# Patient Record
Sex: Female | Born: 1935 | Race: White | Hispanic: No | Smoking: Former smoker
Health system: Southern US, Community
[De-identification: ages and names within clinical notes are randomized; demographics above are authoritative.]

## PROBLEM LIST (undated history)

## (undated) DIAGNOSIS — J300 Vasomotor rhinitis: Secondary | ICD-10-CM

## (undated) DIAGNOSIS — H8110 Benign paroxysmal vertigo, unspecified ear: Secondary | ICD-10-CM

## (undated) DIAGNOSIS — N3090 Cystitis, unspecified without hematuria: Secondary | ICD-10-CM

## (undated) DIAGNOSIS — K219 Gastro-esophageal reflux disease without esophagitis: Secondary | ICD-10-CM

## (undated) DIAGNOSIS — G909 Disorder of the autonomic nervous system, unspecified: Secondary | ICD-10-CM

## (undated) DIAGNOSIS — F418 Other specified anxiety disorders: Secondary | ICD-10-CM

## (undated) DIAGNOSIS — J309 Allergic rhinitis, unspecified: Secondary | ICD-10-CM

## (undated) DIAGNOSIS — R42 Dizziness and giddiness: Secondary | ICD-10-CM

## (undated) DIAGNOSIS — E039 Hypothyroidism, unspecified: Secondary | ICD-10-CM

## (undated) DIAGNOSIS — L219 Seborrheic dermatitis, unspecified: Secondary | ICD-10-CM

## (undated) DIAGNOSIS — F32A Depression, unspecified: Secondary | ICD-10-CM

## (undated) DIAGNOSIS — I4891 Unspecified atrial fibrillation: Secondary | ICD-10-CM

## (undated) DIAGNOSIS — G459 Transient cerebral ischemic attack, unspecified: Secondary | ICD-10-CM

## (undated) DIAGNOSIS — W19XXXA Unspecified fall, initial encounter: Secondary | ICD-10-CM

## (undated) DIAGNOSIS — I10 Essential (primary) hypertension: Secondary | ICD-10-CM

## (undated) DIAGNOSIS — R21 Rash and other nonspecific skin eruption: Secondary | ICD-10-CM

## (undated) DIAGNOSIS — I951 Orthostatic hypotension: Secondary | ICD-10-CM

## (undated) DIAGNOSIS — R296 Repeated falls: Secondary | ICD-10-CM

## (undated) DIAGNOSIS — F329 Major depressive disorder, single episode, unspecified: Secondary | ICD-10-CM

## (undated) DIAGNOSIS — D649 Anemia, unspecified: Secondary | ICD-10-CM

## (undated) DIAGNOSIS — E871 Hypo-osmolality and hyponatremia: Secondary | ICD-10-CM

## (undated) DIAGNOSIS — R531 Weakness: Secondary | ICD-10-CM

## (undated) DIAGNOSIS — G255 Other chorea: Secondary | ICD-10-CM

## (undated) DIAGNOSIS — I69323 Fluency disorder following cerebral infarction: Secondary | ICD-10-CM

## (undated) DIAGNOSIS — M545 Low back pain, unspecified: Secondary | ICD-10-CM

## (undated) DIAGNOSIS — F331 Major depressive disorder, recurrent, moderate: Secondary | ICD-10-CM

## (undated) DIAGNOSIS — Z7901 Long term (current) use of anticoagulants: Secondary | ICD-10-CM

## (undated) DIAGNOSIS — I4892 Unspecified atrial flutter: Secondary | ICD-10-CM

## (undated) HISTORY — DX: Orthostatic hypotension: I95.1

## (undated) HISTORY — DX: Other chorea: G25.5

## (undated) HISTORY — DX: Gastro-esophageal reflux disease without esophagitis: K21.9

## (undated) HISTORY — DX: Other specified anxiety disorders: F41.8

## (undated) HISTORY — DX: Rash and other nonspecific skin eruption: R21

## (undated) HISTORY — DX: Hypo-osmolality and hyponatremia: E87.1

## (undated) HISTORY — DX: Unspecified atrial fibrillation: I48.91

## (undated) HISTORY — DX: Major depressive disorder, recurrent, moderate: F33.1

## (undated) HISTORY — DX: Cystitis, unspecified without hematuria: N30.90

## (undated) HISTORY — DX: Hypomagnesemia: E83.42

## (undated) HISTORY — DX: Repeated falls: R29.6

## (undated) HISTORY — DX: Low back pain: M54.5

## (undated) HISTORY — DX: Dizziness and giddiness: R42

## (undated) HISTORY — DX: Long term (current) use of anticoagulants: Z79.01

## (undated) HISTORY — PX: ABLATION: SHX5711

## (undated) HISTORY — DX: Hypothyroidism, unspecified: E03.9

## (undated) HISTORY — DX: Benign paroxysmal vertigo, unspecified ear: H81.10

## (undated) HISTORY — DX: Depression, unspecified: F32.A

## (undated) HISTORY — PX: WRIST SURGERY: SHX841

## (undated) HISTORY — DX: Unspecified atrial flutter: I48.92

## (undated) HISTORY — PX: KNEE SURGERY: SHX244

## (undated) HISTORY — DX: Unspecified fall, initial encounter: W19.XXXA

## (undated) HISTORY — DX: Seborrheic dermatitis, unspecified: L21.9

## (undated) HISTORY — DX: Fluency disorder following cerebral infarction: I69.323

## (undated) HISTORY — DX: Disorder of the autonomic nervous system, unspecified: G90.9

## (undated) HISTORY — DX: Major depressive disorder, single episode, unspecified: F32.9

## (undated) HISTORY — DX: Vasomotor rhinitis: J30.0

## (undated) HISTORY — DX: Weakness: R53.1

## (undated) HISTORY — DX: Transient cerebral ischemic attack, unspecified: G45.9

## (undated) HISTORY — DX: Essential (primary) hypertension: I10

## (undated) HISTORY — DX: Allergic rhinitis, unspecified: J30.9

## (undated) HISTORY — DX: Low back pain, unspecified: M54.50

## (undated) HISTORY — DX: Anemia, unspecified: D64.9

---

## 2016-04-19 HISTORY — PX: CARDIOVERSION: SHX1299

## 2018-06-15 ENCOUNTER — Ambulatory Visit: Payer: Medicare Other | Admitting: Neurology

## 2018-06-15 ENCOUNTER — Telehealth: Payer: Self-pay | Admitting: Neurology

## 2018-06-15 ENCOUNTER — Encounter: Payer: Self-pay | Admitting: Neurology

## 2018-06-15 VITALS — BP 106/62 | HR 70 | Ht 65.0 in | Wt 128.6 lb

## 2018-06-15 DIAGNOSIS — H9201 Otalgia, right ear: Secondary | ICD-10-CM

## 2018-06-15 DIAGNOSIS — I951 Orthostatic hypotension: Secondary | ICD-10-CM | POA: Diagnosis not present

## 2018-06-15 DIAGNOSIS — I639 Cerebral infarction, unspecified: Secondary | ICD-10-CM

## 2018-06-15 DIAGNOSIS — H9193 Unspecified hearing loss, bilateral: Secondary | ICD-10-CM

## 2018-06-15 DIAGNOSIS — H814 Vertigo of central origin: Secondary | ICD-10-CM

## 2018-06-15 DIAGNOSIS — H5711 Ocular pain, right eye: Secondary | ICD-10-CM

## 2018-06-15 DIAGNOSIS — I10 Essential (primary) hypertension: Secondary | ICD-10-CM

## 2018-06-15 DIAGNOSIS — G909 Disorder of the autonomic nervous system, unspecified: Secondary | ICD-10-CM

## 2018-06-15 DIAGNOSIS — F329 Major depressive disorder, single episode, unspecified: Secondary | ICD-10-CM

## 2018-06-15 DIAGNOSIS — G459 Transient cerebral ischemic attack, unspecified: Secondary | ICD-10-CM

## 2018-06-15 DIAGNOSIS — W19XXXA Unspecified fall, initial encounter: Secondary | ICD-10-CM

## 2018-06-15 DIAGNOSIS — H9191 Unspecified hearing loss, right ear: Secondary | ICD-10-CM

## 2018-06-15 DIAGNOSIS — E039 Hypothyroidism, unspecified: Secondary | ICD-10-CM

## 2018-06-15 DIAGNOSIS — R296 Repeated falls: Secondary | ICD-10-CM | POA: Diagnosis not present

## 2018-06-15 DIAGNOSIS — R270 Ataxia, unspecified: Secondary | ICD-10-CM

## 2018-06-15 DIAGNOSIS — I480 Paroxysmal atrial fibrillation: Secondary | ICD-10-CM

## 2018-06-15 DIAGNOSIS — H8110 Benign paroxysmal vertigo, unspecified ear: Secondary | ICD-10-CM

## 2018-06-15 DIAGNOSIS — R42 Dizziness and giddiness: Secondary | ICD-10-CM

## 2018-06-15 DIAGNOSIS — F32A Depression, unspecified: Secondary | ICD-10-CM

## 2018-06-15 NOTE — Progress Notes (Signed)
[facility]    Provider:  Dr Lucia Gaskins Referring Provider: Florentina Jenny, MD Primary Care Provider:  Florentina Jenny, MD  CC:  Dizziness, sharp, short head pain and flashes of light at times.  HPI:  Emma Yang is a 85 y.o. female here as requested by provider Florentina Jenny, MD for "dizzy spells and mini strokes".  Here with her daughter who provides much information.  PMHx Afib, HTN, benign positional vertigo, TIA, long-term anticoagulant use, hypothyroidism, low back pain, repeated falls, depression, orthostatic hypotension, recurrent cystitis, generalized weakness.  She had a fall last summer. Years ago she was diagnosed with an autonomic disorder of unknown type . She has dizzy spells for years. She has sun sensitivity. She has done well after last cardioversion. Last summer she had a bad fall. She moved to [facility] in assisted living. Physically she is getting stronger. She is afraid when she is dizzy. Happens often in the morning when she wakes up. She eats quickly. She tries to go to exercise class but the dizziness. More prevalent in the mornings. May have an episode during the day as well. She has room spinning. Happens when rolls over in bed. Or moves head. The epley maneuvers helped in the past, way back. The epley maneuvers are helping her. She has hearing loss. She has vertigo. Snores very heavily per daughter. She has abnormal movements at night, been bruised at night. She is very tired and takes naps. She has sharp and intense pain, stabs in the head, been happening daily. Can happen -2x a day, brief and severe, short. Mostly at night when sleeping. It can be several jabs in a row. Migraines growing up.   Reviewed notes, labs and imaging from outside physicians, which showed: Reviewed notes from Dr. Redmond School.  Patient has been seen multiple time for evaluation of continued dizziness.  Patient is homebound due to severe cognitive impairment, deconditioning, poor vision  and difficulty walking without assistance.  Patient notes that when she stands up and turns her head a certain when she becomes dizzy with an sensation of room spinning.  She does have orthostasis and they recently discontinued propranolol.  No recent falls however she has fallen multiple times in the past.  She does have atrial fibrillation and currently asymptomatic without palpitations or tachycardia exertional dyspnea or other symptoms.  There was no breakthrough heart rate increase following discontinuation of the metoprolol.  She is however continued another blood pressure medications.  Dr. Redmond School thought that the timing and nature of symptoms suggest BPPV, and she was sent to physical therapy who has been performing Epley maneuvers on her.  He discovered she was still orthostatic and they also discontinued her amlodipine.  They are sending her to a local cardiologist for atrial fibrillation.  Continuing her long-term anticoagulation.  She is been noted to have lightheadedness with walking.  Evident when she works with physical therapy.  She did note tremor of her hands while weaning Lexapro which may be the cause.  She remains clinically euthyroid.  Labs were taken April 13, 2018 and include unremarkable CMP with BUN 16 and creatinine 0.8, hemoglobin A1c 5.5, TSH 3.67, B12 359, folate 5.9, unremarkable CBC.  Review of Systems: Patient complains of symptoms per HPI as well as the following symptoms: Fatigue, blurred vision, easy bruising, easy bleeding, memory loss, confusion, headache, weakness, dizziness, tremor, snoring, hearing loss, incontinence, joint pain, aching muscles, decreased energy. Pertinent negatives and positives per HPI. All others negative.   Social  History   Socioeconomic History  . Marital status: Widowed    Spouse name: Not on file  . Number of children: Not on file  . Years of education: Not on file  . Highest education level: Not on file  Occupational History  . Not  on file  Social Needs  . Financial resource strain: Not on file  . Food insecurity:    Worry: Not on file    Inability: Not on file  . Transportation needs:    Medical: Not on file    Non-medical: Not on file  Tobacco Use  . Smoking status: Former Smoker    Last attempt to quit: 04/20/1967    Years since quitting: 51.1  . Smokeless tobacco: Never Used  Substance and Sexual Activity  . Alcohol use: Not Currently    Comment: quit 09/2017  . Drug use: Never  . Sexual activity: Not on file  Lifestyle  . Physical activity:    Days per week: Not on file    Minutes per session: Not on file  . Stress: Not on file  Relationships  . Social connections:    Talks on phone: Not on file    Gets together: Not on file    Attends religious service: Not on file    Active member of club or organization: Not on file    Attends meetings of clubs or organizations: Not on file    Relationship status: Not on file  . Intimate partner violence:    Fear of current or ex partner: Not on file    Emotionally abused: Not on file    Physically abused: Not on file    Forced sexual activity: Not on file  Other Topics Concern  . Not on file  Social History Narrative   Lives at [facility].  Widowed.  Education some college.  Children 3.      Family History  Problem Relation Age of Onset  . Hypotension Neg Hx     Past Medical History:  Diagnosis Date  . A-fib (HCC)   . Autonomic disorder    ANS  . BPPV (benign paroxysmal positional vertigo)   . Depression   . Dizziness   . Falls   . Hypertension   . Hypotension   . Hypothyroid   . Orthostatic hypotension   . TIA (transient ischemic attack)   . Vertigo     Patient Active Problem List   Diagnosis Date Noted  . Vertigo   . TIA (transient ischemic attack)   . Orthostatic hypotension   . Hypothyroid   . Hypotension   . Hypertension   . Falls   . Dizziness   . Depression   . BPPV (benign paroxysmal positional vertigo)   .  Autonomic disorder   . A-fib [facility])     Past Surgical History:  Procedure Laterality Date  . KNEE SURGERY    . WRIST SURGERY      Current Outpatient Medications  Medication Sig Dispense Refill  . apixaban (ELIQUIS) 2.5 MG TABS tablet Take 2.5 mg by mouth 2 (two) times daily.     . Cholecalciferol (VITAMIN D-3 PO) Take by mouth. 2000u by mouth daily    . dofetilide (TIKOSYN) 250 MCG capsule Take 250 mcg by mouth 2 (two) times daily.    . ferrous sulfate 325 (65 FE) MG tablet Take 325 mg by mouth 2 (two) times daily with a meal.     . fluticasone (FLONASE) 50 MCG/ACT nasal spray Place  1 spray into both nostrils daily.     Marland Kitchen losartan (COZAAR) 50 MG tablet Take 50 mg by mouth daily.     . magnesium oxide (MAG-OX) 400 MG tablet Take 400 mg by mouth daily.     Marland Kitchen omeprazole (PRILOSEC) 20 MG capsule Take 20 mg by mouth daily.     . vitamin B-12 (CYANOCOBALAMIN) 1000 MCG tablet Take 1,000 mcg by mouth daily.      No current facility-administered medications for this visit.     Allergies as of 06/15/2018 - Review Complete 06/15/2018  Allergen Reaction Noted  . Formaldehyde Itching 03/07/2018    Vitals: BP 106/62   Pulse 70   Ht  (1.651 m)   Wt 128 lb 9.6 oz (58.3 kg)   SpO2 95%   BMI 21.40 kg/m  Last Weight:  Wt Readings from Last 1 Encounters:  06/15/18 128 lb 9.6 oz (58.3 kg)   Last Height:   Ht Readings from Last 1 Encounters:  06/15/18  (1.651 m)   Physical exam: Exam: Gen: NAD, conversant,                 CV: irregular, no MRG. No Carotid Bruits. 1+ distal LE pitting peripheral edema, warm, nontender Eyes: Conjunctivae clear without exudates or hemorrhage  Neuro: Detailed Neurologic Exam  Speech:    Speech is normal; fluent and spontaneous with normal comprehension.  Cognition:    The patient is oriented to person, month, year.    recent and remote memory impaired;     language fluent;     Impaired attention, concentration, fund of knowledge Cranial  Nerves:    The pupils are equal, round, attempted funduscopy could not visualize due to small pupils.  Visual fields are full to finger confrontation. Extraocular movements are intact. Trigeminal sensation is intact and the muscles of mastication are normal. The face is symmetric (some possible swelling of the right eyelid). The palate elevates in the midline. Hearing impaired. Voice is normal. Shoulder shrug is normal. The tongue has normal motion without fasciculations.   Coordination:    No dysmetria noted  Gait:   Slightly wide based and shuffling, slight stooped and decreased arm swing with en bloc turning  Motor Observation:     no involuntary movements noted. Tone:    Normal muscle tone.      Strength:  prox weaness in LE 3+/5, upper extremities 4+/5, symmetrical        Strength is V/V in the upper and lower limbs.      Sensation: intact to LT     Reflex Exam:  DTR's:    Absent AJs otherwise deep tendon reflexes in the upper and lower extremities are brisk bilaterally.   Toes:    The toes are downgoing bilaterally.   Clonus:    Clonus is absent.    Assessment/Plan:   85 y.o. female here as requested by provider Florentina Jenny, MD for "dizzy spells and mini strokes".  Here with her daughter who provides much information.  PMHx Afib, HTN, benign positional vertigo, TIA, long-term anticoagulant use, hypothyroidism, low back pain, repeated falls, depression, orthostatic hypotension, recurrent cystitis, generalized weakness.  -Patient's primary physician has been trying to decrease her high blood pressure medication due to orthostatic hypotension.  Today she is again orthostatic.  Sitting 131/76 pulse 63, standing 108/63 pulse 67, standing for 2 minutes 93/60 pulse 66.  She was diagnosed with an autonomic disorder in the past unknown what kind but possibly may  be related to her orthostasis.   -Discussed the use of midodrine and fludrocortisone and others for blood pressure  support.  However also discussed risk of hypertension especially supine.  I will talk to her PCP Florentina Jenny and see what he thinks.  We may consider compression hose and discontinuing all of her blood pressure medications before going this route.  -Patient's dizziness may be multifactorial including orthostatic hypotension, benign positional vertigo or other central vertigo such as vertigo associated with migraines.  But we need to consider all possibilities.  She has right ear pain/stuffiness, will refer her to Dr. Haroldine Laws for evaluation of this and hearing loss possibly Mnire's disease.  -We will order MRI of the brain with and without contrast to evaluate for other intracranial etiology such as strokes or lesions such as CN VIII schwannoma.  -She wakes up dizzy every morning and snores heavily, discussed sleep evaluation and the risks of obstructive sleep apnea if untreated, they would like to hold off on that right now.   Orders Placed This Encounter  Procedures  . MR BRAIN W WO CONTRAST  . Ambulatory referral to ENT   F/u in 3 months or sooner  Cc: Florentina Jenny, MD,    Naomie Dean, MD  [facility] [address] [place], [place] [address]  Phone [phone] Fax [phone]

## 2018-06-15 NOTE — Patient Instructions (Addendum)
MRI brain Refer to Dr. Haroldine Laws Sleep eval? Dizziness Dizziness is a common problem. It is a feeling of unsteadiness or light-headedness. You may feel like you are about to faint. Dizziness can lead to injury if you stumble or fall. Anyone can become dizzy, but dizziness is more common in older adults. This condition can be caused by a number of things, including medicines, dehydration, or illness. Follow these instructions at home: Eating and drinking  Drink enough fluid to keep your urine clear or pale yellow. This helps to keep you from becoming dehydrated. Try to drink more clear fluids, such as water.  Do not drink alcohol.  Limit your caffeine intake if told to do so by your health care provider. Check ingredients and nutrition facts to see if a food or beverage contains caffeine.  Limit your salt (sodium) intake if told to do so by your health care provider. Check ingredients and nutrition facts to see if a food or beverage contains sodium. Activity  Avoid making quick movements. ? Rise slowly from chairs and steady yourself until you feel okay. ? In the morning, first sit up on the side of the bed. When you feel okay, stand slowly while you hold onto something until you know that your balance is fine.  If you need to stand in one place for a long time, move your legs often. Tighten and relax the muscles in your legs while you are standing.  Do not drive or use heavy machinery if you feel dizzy.  Avoid bending down if you feel dizzy. Place items in your home so that they are easy for you to reach without leaning over. Lifestyle  Do not use any products that contain nicotine or tobacco, such as cigarettes and e-cigarettes. If you need help quitting, ask your health care provider.  Try to reduce your stress level by using methods such as yoga or meditation. Talk with your health care provider if you need help to manage your stress. General instructions  Watch your dizziness for  any changes.  Take over-the-counter and prescription medicines only as told by your health care provider. Talk with your health care provider if you think that your dizziness is caused by a medicine that you are taking.  Tell a friend or a family member that you are feeling dizzy. If he or she notices any changes in your behavior, have this person call your health care provider.  Keep all follow-up visits as told by your health care provider. This is important. Contact a health care provider if:  Your dizziness does not go away.  Your dizziness or light-headedness gets worse.  You feel nauseous.  You have reduced hearing.  You have new symptoms.  You are unsteady on your feet or you feel like the room is spinning. Get help right away if:  You vomit or have diarrhea and are unable to eat or drink anything.  You have problems talking, walking, swallowing, or using your arms, hands, or legs.  You feel generally weak.  You are not thinking clearly or you have trouble forming sentences. It may take a friend or family member to notice this.  You have chest pain, abdominal pain, shortness of breath, or sweating.  Your vision changes.  You have any bleeding.  You have a severe headache.  You have neck pain or a stiff neck.  You have a fever. These symptoms may represent a serious problem that is an emergency. Do not wait to see if  the symptoms will go away. Get medical help right away. Call your local emergency services (911 in the [place] Do not drive yourself to the hospital. Summary  Dizziness is a feeling of unsteadiness or light-headedness. This condition can be caused by a number of things, including medicines, dehydration, or illness.  Anyone can become dizzy, but dizziness is more common in older adults.  Drink enough fluid to keep your urine clear or pale yellow. Do not drink alcohol.  Avoid making quick movements if you feel dizzy. Monitor your dizziness for any  changes. This information is not intended to replace advice given to you by your health care provider. Make sure you discuss any questions you have with your health care provider. Document Released: 09/29/2000 Document Revised: 05/08/2016 Document Reviewed: 05/08/2016 Elsevier Interactive Patient Education  2019 [employer].

## 2018-06-15 NOTE — Telephone Encounter (Signed)
Call Emma Yang about midodrine or other BP support due to orthostasis today again.

## 2018-06-16 ENCOUNTER — Telehealth: Payer: Self-pay | Admitting: Neurology

## 2018-06-16 ENCOUNTER — Encounter: Payer: Self-pay | Admitting: Neurology

## 2018-06-16 DIAGNOSIS — W19XXXA Unspecified fall, initial encounter: Secondary | ICD-10-CM | POA: Insufficient documentation

## 2018-06-16 DIAGNOSIS — R42 Dizziness and giddiness: Secondary | ICD-10-CM | POA: Insufficient documentation

## 2018-06-16 DIAGNOSIS — G909 Disorder of the autonomic nervous system, unspecified: Secondary | ICD-10-CM | POA: Insufficient documentation

## 2018-06-16 DIAGNOSIS — I4891 Unspecified atrial fibrillation: Secondary | ICD-10-CM | POA: Insufficient documentation

## 2018-06-16 DIAGNOSIS — G459 Transient cerebral ischemic attack, unspecified: Secondary | ICD-10-CM | POA: Insufficient documentation

## 2018-06-16 DIAGNOSIS — I10 Essential (primary) hypertension: Secondary | ICD-10-CM | POA: Insufficient documentation

## 2018-06-16 DIAGNOSIS — F32A Depression, unspecified: Secondary | ICD-10-CM | POA: Insufficient documentation

## 2018-06-16 DIAGNOSIS — H8110 Benign paroxysmal vertigo, unspecified ear: Secondary | ICD-10-CM | POA: Insufficient documentation

## 2018-06-16 DIAGNOSIS — E039 Hypothyroidism, unspecified: Secondary | ICD-10-CM | POA: Insufficient documentation

## 2018-06-16 DIAGNOSIS — F329 Major depressive disorder, single episode, unspecified: Secondary | ICD-10-CM | POA: Insufficient documentation

## 2018-06-16 DIAGNOSIS — I959 Hypotension, unspecified: Secondary | ICD-10-CM | POA: Insufficient documentation

## 2018-06-16 DIAGNOSIS — I951 Orthostatic hypotension: Secondary | ICD-10-CM | POA: Insufficient documentation

## 2018-06-16 NOTE — Telephone Encounter (Signed)
[employer] medicare order sent to [facility]. No auth they will reach out to the pt to schedule.  °

## 2018-06-19 ENCOUNTER — Other Ambulatory Visit: Payer: Self-pay | Admitting: Neurology

## 2018-06-19 ENCOUNTER — Telehealth: Payer: Self-pay | Admitting: Neurology

## 2018-06-19 MED ORDER — ALPRAZOLAM 0.25 MG PO TABS: ORAL_TABLET | ORAL | 0 refills | Status: DC

## 2018-06-19 NOTE — Telephone Encounter (Signed)
Copied from CRM [id number]. Topic: General - Other >> Jun 19, 2018 11:24 AM Gean Birchwood R wrote:  Patients daughter Isabel Caprice) called in and stated patient has MRI scheduled for 06/25/2018 and patient is claustrophobic and wants to know if something can be prescribed for the MRI  CB# [phone]  Patient is in a Assisted Living prescription would need to be sent there  [facility]  Fax# [phone]

## 2018-06-19 NOTE — Telephone Encounter (Signed)
I called the main number, they told me to send an email which I did trying to find contact infor for physician thanks

## 2018-06-19 NOTE — Telephone Encounter (Signed)
I printed prescription, will give to bethany

## 2018-06-19 NOTE — Telephone Encounter (Signed)
I called [facility] [place]) to confirm the fax number is correct. I faxed the Xanax prescription to [phone]. Received a receipt of confirmation.

## 2018-06-19 NOTE — Telephone Encounter (Signed)
I returned Gay's call @ [phone] and told her a prescription had been faxed. Did not go into details. She verbalized appreciation and said she would make sure they got it.

## 2018-06-19 NOTE — Telephone Encounter (Signed)
Discussed orthostasis with Dr. Redmond School. I recommend cardiology s they can eval for posible using midodrine or other medication, support hose, abdominal binder. He is going to speak with them today.

## 2018-06-23 ENCOUNTER — Telehealth: Payer: Self-pay

## 2018-06-23 NOTE — Telephone Encounter (Signed)
Notes on file.  

## 2018-06-24 ENCOUNTER — Other Ambulatory Visit: Payer: Self-pay

## 2018-06-25 ENCOUNTER — Ambulatory Visit
Admission: RE | Admit: 2018-06-25 | Discharge: 2018-06-25 | Disposition: A | Discharge status: Home / Self Care | Payer: Medicare Other | Source: Ambulatory Visit | Attending: Neurology | Admitting: Neurology

## 2018-06-25 DIAGNOSIS — I639 Cerebral infarction, unspecified: Secondary | ICD-10-CM

## 2018-06-25 DIAGNOSIS — R42 Dizziness and giddiness: Secondary | ICD-10-CM

## 2018-06-25 DIAGNOSIS — H814 Vertigo of central origin: Secondary | ICD-10-CM

## 2018-06-25 DIAGNOSIS — R296 Repeated falls: Secondary | ICD-10-CM

## 2018-06-25 DIAGNOSIS — H9191 Unspecified hearing loss, right ear: Secondary | ICD-10-CM

## 2018-06-25 DIAGNOSIS — R270 Ataxia, unspecified: Secondary | ICD-10-CM

## 2018-06-25 DIAGNOSIS — H5711 Ocular pain, right eye: Secondary | ICD-10-CM

## 2018-06-25 MED ORDER — GADOBENATE DIMEGLUMINE 529 MG/ML IV SOLN
10.0000 mL | Freq: Once | INTRAVENOUS | Status: AC | PRN
  Administered 2018-06-25: 10 mL via INTRAVENOUS

## 2018-06-27 ENCOUNTER — Telehealth: Payer: Self-pay | Admitting: *Deleted

## 2018-06-27 NOTE — Telephone Encounter (Signed)
-----   Message from Antonia B Ahern, MD sent at 06/26/2018  5:59 PM EDT ----- MRI of the brain did not show anything acute, no acute strokes, nothing acute to account for her dizziness.However there was white matter changes consistent with atherosclerosis/plaque (hardenong of the arteries) in the brain. This can be normally seen in aging but also with vascular risk factors.  Its difficult to discuss and explain over the phone so I'd rather have them in to discuss. But these changes are chronic and nothing we can do about them except manage vascular risk factors. If they want to come back sooner than May I'm happy to see them sooner to review with them. thanks 

## 2018-06-27 NOTE — Telephone Encounter (Signed)
Spoke with pt's dtr Rivka Barbara ("Gay") and reviewed below MRI results. She verbalized understanding of same, would like to leave f/u appt. as it is on 08/23/18./fim

## 2018-06-27 NOTE — Telephone Encounter (Signed)
-----   Message from Anson Fret, MD sent at 06/26/2018  5:59 PM EDT ----- MRI of the brain did not show anything acute, no acute strokes, nothing acute to account for her dizziness.However there was white matter changes consistent with atherosclerosis/plaque (hardenong of the arteries) in the brain. This can be normally seen in aging but also with vascular risk factors.  Its difficult to discuss and explain over the phone so I'd rather have them in to discuss. But these changes are chronic and nothing we can do about them except manage vascular risk factors. If they want to come back sooner than May I'm happy to see them sooner to review with them. thanks

## 2018-06-27 NOTE — Telephone Encounter (Signed)
Attempted to contact dtr. Joy (on hippa) with MRI results, but received message that vm is full. LMOM for dtr. Chesnee (on hippa) to call for MRI results/fim

## 2018-07-03 ENCOUNTER — Encounter: Payer: Self-pay | Admitting: Cardiology

## 2018-07-12 ENCOUNTER — Ambulatory Visit: Payer: Medicare Other | Admitting: Cardiology

## 2018-07-19 ENCOUNTER — Telehealth: Payer: Self-pay | Admitting: *Deleted

## 2018-07-19 NOTE — Telephone Encounter (Signed)
Spoke with pt daughter to offer an e-visit, she stated her mom is feeling much better she think it was possible some anxiety, she also stated if she need [place] she will give [place] a call back after this covid19 is over.

## 2018-07-19 NOTE — Telephone Encounter (Signed)
-----   Message from Rollene Rotunda, MD sent at 07/18/2018  1:23 PM EDT ----- Offer a telehealth visit for Wed or Thursday.  Document that we offered the visit if she does not want this at this time.   ----- Message ----- From: Abelino Derrick, PA-C Sent: 07/07/2018   2:42 PM EDT To: Rollene Rotunda, MD  Dr Antoine Poche this appears to be another new patient.  Corine Shelter PA-C 07/07/2018 2:42 PM

## 2018-08-01 ENCOUNTER — Emergency Department (HOSPITAL_COMMUNITY)
Admission: EM | Admit: 2018-08-01 | Discharge: 2018-08-01 | Disposition: A | Discharge status: Home / Self Care | Payer: Medicare Other | Attending: Emergency Medicine | Admitting: Emergency Medicine

## 2018-08-01 ENCOUNTER — Encounter (HOSPITAL_COMMUNITY): Payer: Self-pay | Admitting: Emergency Medicine

## 2018-08-01 ENCOUNTER — Other Ambulatory Visit: Payer: Self-pay

## 2018-08-01 DIAGNOSIS — Z79899 Other long term (current) drug therapy: Secondary | ICD-10-CM | POA: Diagnosis not present

## 2018-08-01 DIAGNOSIS — Z87891 Personal history of nicotine dependence: Secondary | ICD-10-CM | POA: Insufficient documentation

## 2018-08-01 DIAGNOSIS — R040 Epistaxis: Secondary | ICD-10-CM

## 2018-08-01 DIAGNOSIS — Z8673 Personal history of transient ischemic attack (TIA), and cerebral infarction without residual deficits: Secondary | ICD-10-CM | POA: Insufficient documentation

## 2018-08-01 DIAGNOSIS — E039 Hypothyroidism, unspecified: Secondary | ICD-10-CM | POA: Diagnosis not present

## 2018-08-01 DIAGNOSIS — Z7901 Long term (current) use of anticoagulants: Secondary | ICD-10-CM | POA: Insufficient documentation

## 2018-08-01 DIAGNOSIS — I10 Essential (primary) hypertension: Secondary | ICD-10-CM | POA: Diagnosis not present

## 2018-08-01 MED ORDER — OXYMETAZOLINE HCL 0.05 % NA SOLN
1.0000 | Freq: Once | NASAL | Status: AC
  Administered 2018-08-01: 1 via NASAL
  Filled 2018-08-01: qty 30

## 2018-08-01 MED ORDER — TRANEXAMIC ACID 1000 MG/10ML IV SOLN
500.0000 mg | Freq: Once | INTRAVENOUS | Status: AC
  Administered 2018-08-01: 16:00:00 500 mg via TOPICAL
  Filled 2018-08-01: qty 10

## 2018-08-01 NOTE — ED Notes (Signed)
Patient verbalizes understanding of discharge instructions. Opportunity for questioning and answers were provided. Armband removed by staff, pt discharged from ED.  

## 2018-08-01 NOTE — ED Provider Notes (Signed)
[facility] EMERGENCY DEPARTMENT Provider Note   CSN: [id number] Arrival date & time: 08/01/18  1253    History   Chief Complaint Chief Complaint  Patient presents with  . Epistaxis    HPI Emma Yang is a 85 y.o. female.     HPI    13 YOF presents today with epistaxis.  She notes approximately 1 hour prior to arrival she started having epistaxis.  She notes this mostly coming out of the front of her nose, she cannot localize which side it is coming out of.  She denies any preceding headache, infectious symptoms including rhinorrhea nasal congestion, she does note some fullness in her ears.  She denies any normal bruising or bleeding.  She denies any trauma.  She reports a history of the same that required packing.  Is currently on Eliquis for atrial fibrillation but denies any symptoms secondary to A. Fib.  Does take medication for hypertension.  Past Medical History:  Diagnosis Date  . A-fib (HCC)    POST CARDIOVERSION  . Anticoagulant long-term use   . Autonomic disorder    ANS  . BPPV (benign paroxysmal positional vertigo)    UNSPECIFIED LATERALITY  . Choreoathetosis   . Depression   . Dizziness   . Falls   . Fluency disorder following cerebral infarction   . General weakness    DEBILITY  . GERD (gastroesophageal reflux disease)   . Hypertension   . Hyponatremia   . Hypothyroid   . Low back pain without sciatica    UNSPECIFIED BACK PAIN LATERALITY, UNSPECIFIED CHRONICITY  . MDD (major depressive disorder), recurrent episode, moderate (HCC)   . Orthostatic hypotension   . Rash   . Recurrent cystitis   . Recurrent falls   . TIA (transient ischemic attack)   . Vertigo     Patient Active Problem List   Diagnosis Date Noted  . Vertigo   . TIA (transient ischemic attack)   . Orthostatic hypotension   . Hypothyroid   . Hypotension   . Hypertension   . Falls   . Dizziness   . Depression   . BPPV (benign paroxysmal positional  vertigo)   . Autonomic disorder   . A-fib [facility])     Past Surgical History:  Procedure Laterality Date  . KNEE SURGERY    . WRIST SURGERY       OB History   No obstetric history on file.      Home Medications    Prior to Admission medications   Medication Sig Start Date End Date Taking? Authorizing Provider  apixaban (ELIQUIS) 2.5 MG TABS tablet Take 2.5 mg by mouth 2 (two) times daily.    Yes [provider]  dofetilide (TIKOSYN) 250 MCG capsule Take 250 mcg by mouth 2 (two) times daily.   Yes [provider]  levothyroxine (SYNTHROID, LEVOTHROID) 25 MCG tablet Take 25 mcg by mouth daily at 12 noon. 07/17/18  Yes [provider]  ALPRAZolam (XANAX) 0.25 MG tablet Take 1-2 tabs (0.25mg -0.50mg ) 30-60 minutes before procedure. May repeat if needed.Do not drive. 06/19/18   Anson Fret, MD  Cholecalciferol (VITAMIN D-3 PO) Take by mouth. 2000u by mouth daily    [provider]  ferrous sulfate 325 (65 FE) MG tablet Take 325 mg by mouth 2 (two) times daily with a meal.     [provider]  fluticasone (FLONASE) 50 MCG/ACT nasal spray Place 1 spray into both nostrils daily.  06/02/18  [provider]  losartan (COZAAR) 50 MG tablet Take 50 mg by mouth daily.  01/16/18   [provider]  magnesium oxide (MAG-OX) 400 MG tablet Take 400 mg by mouth daily.     [provider]  omeprazole (PRILOSEC) 20 MG capsule Take 20 mg by mouth daily.     [provider]  vitamin B-12 (CYANOCOBALAMIN) 1000 MCG tablet Take 1,000 mcg by mouth daily.     [provider]    Family History Family History  Problem Relation Age of Onset  . Hypotension Neg Hx     Social History Social History   Tobacco Use  . Smoking status: Former Smoker    Last attempt to quit: 04/20/1967    Years since quitting: 51.3  . Smokeless tobacco: Never Used  Substance Use Topics  . Alcohol use: Not Currently    Comment: quit 09/2017   . Drug use: Never     Allergies   Formaldehyde; Other; Levaquin [levofloxacin]; and Tape   Review of Systems Review of Systems  All other systems reviewed and are negative.    Physical Exam Updated Vital Signs BP (!) 191/116 (BP Location: Left Arm)   Pulse 94   Temp 98.1 F (36.7 C) (Oral)   Resp 18   SpO2 98%   Physical Exam Vitals signs and nursing note reviewed.  Constitutional:      Appearance: She is well-developed.  HENT:     Head: Normocephalic and atraumatic.     Comments: Bleeding from left anterior nare, no posterior findings, no right-sided bleeding, no trauma to the nose Eyes:     General: No scleral icterus.       Right eye: No discharge.        Left eye: No discharge.     Conjunctiva/sclera: Conjunctivae normal.     Pupils: Pupils are equal, round, and reactive to light.  Neck:     Musculoskeletal: Normal range of motion.     Vascular: No JVD.     Trachea: No tracheal deviation.  Pulmonary:     Effort: Pulmonary effort is normal.     Breath sounds: No stridor.  Skin:    Comments: No abnormal bruising bleeding  Neurological:     Mental Status: She is alert and oriented to person, place, and time.     Coordination: Coordination normal.  Psychiatric:        Behavior: Behavior normal.        Thought Content: Thought content normal.        Judgment: Judgment normal.      ED Treatments / Results  Labs (all labs ordered are listed, but only abnormal results are displayed) Labs Reviewed - No data to display  EKG None  Radiology No results found.  Procedures .Epistaxis Management Date/Time: 08/01/2018 3:16 PM Performed by: Eyvonne Mechanic, PA-C Authorized by: Eyvonne Mechanic, PA-C   Consent:    Consent obtained:  Verbal   Consent given by:  Patient   Risks discussed:  Bleeding, infection, nasal injury and pain   Alternatives discussed:  No treatment and alternative treatment Anesthesia (see MAR for exact dosages):    Anesthesia  method:  None Procedure details:    Treatment site:  L anterior   Treatment method:  Anterior pack and nasal balloon   Treatment complexity:  Limited   Treatment episode: initial   Post-procedure details:    Assessment:  Bleeding stopped   Patient tolerance of procedure:  Tolerated well, no immediate  complications   (including critical care time)  Medications Ordered in ED Medications  oxymetazoline (AFRIN) 0.05 % nasal spray 1 spray (1 spray Each Nare Given 08/01/18 1301)  tranexamic acid (CYKLOKAPRON) injection 500 mg (500 mg Topical Given by Other 08/01/18 1611)     Initial Impression / Assessment and Plan / ED Course  I have reviewed the triage vital signs and the nursing notes.  Pertinent labs & imaging results that were available during my care of the patient were reviewed by me and considered in my medical decision making (see chart for details).       Assessment/Plan: 85 year old female presents today with epistaxis.  No obvious lesions amenable to cautery at this time.  I attempted packing the nose with gauze soaked in Afrin and this was unsuccessful, TXA was used with gauze again unsuccessful.  Patient had rapid Rhino placed which stopped her bleeding.  She will be monitored here in the ED and then discharged to her assisted living facility.  Patient is hypertensive here, encouraged her to monitor this at the living facility and contact her primary care provider for reevaluation of this.  Patient verbalized understanding and agreement to today's plan had no further questions or concerns      Final Clinical Impressions(s) / ED Diagnoses   Final diagnoses:  Hypertension, unspecified type  Epistaxis    ED Discharge Orders    None       Rosalio LoudHedges, Abigail Marsiglia, PA-C 08/02/18 1051    Gerhard MunchLockwood, Robert, MD 08/03/18 [phone]

## 2018-08-01 NOTE — ED Triage Notes (Signed)
Pt arrives via EMS from assisted living. Pt has nose bleed that started approx 45 min ago. HR 90-100. Denies falls/injury. EMS gave 2 spray afrin both nares. Temp 99.1 per EMS. Pt on blood thinners.

## 2018-08-01 NOTE — Discharge Instructions (Signed)
Please read attached information. If you experience any new or worsening signs or symptoms please return to the emergency room for evaluation. Please follow-up with your primary care provider or specialist as discussed.  °

## 2018-08-23 ENCOUNTER — Ambulatory Visit: Payer: Medicare Other | Admitting: Neurology

## 2018-10-11 ENCOUNTER — Ambulatory Visit: Payer: Medicare Other | Admitting: Neurology

## 2018-11-20 ENCOUNTER — Telehealth: Payer: Self-pay | Admitting: *Deleted

## 2018-11-20 ENCOUNTER — Ambulatory Visit (INDEPENDENT_AMBULATORY_CARE_PROVIDER_SITE_OTHER): Payer: Medicare Other | Admitting: Neurology

## 2018-11-20 ENCOUNTER — Other Ambulatory Visit: Payer: Self-pay

## 2018-11-20 ENCOUNTER — Encounter: Payer: Self-pay | Admitting: Neurology

## 2018-11-20 VITALS — BP 145/71 | HR 98 | Temp 97.5°F | Ht 66.0 in | Wt 137.0 lb

## 2018-11-20 DIAGNOSIS — F0150 Vascular dementia without behavioral disturbance: Secondary | ICD-10-CM

## 2018-11-20 NOTE — Telephone Encounter (Signed)
Spoke with pt's daughter Abner Greenspan (on [place]) re: today's appt. Pt's daughter stated her assisted living facility allows pt to leave for doctor's appts. She denied any known recent exposure of pt to [facility] and stated they haven't had a case at the facility in about 2 months. Pt's other daughter Caryl Asp will be bringing her to the appt today. She did not feel telemedicine would be useful for pt's appt.

## 2018-11-20 NOTE — Progress Notes (Signed)
[facility]    Provider:  Dr Jaynee Eagles Referring Provider: Reymundo Poll, MD Primary Care Provider:  Reymundo Poll, MD  CC:  Dizziness, sharp, short head pain and flashes of light at times.  Interval history 11/28/2018: Patient is here with daughter. Patient feels stable. She has been diagnosed with atrial flutter in the last month and is following for that.  We discussed vascular dementia, I reviewed images with patient and daughter of the brain which showed extensive and confluent microvascular ischemic changes and generalized cortical atrophy without any etiology for vertigo and dizziness.  I suggested lab work and family does not feel it is necessary at this time.  Discussed Aricept and daughter will let me know.  At this time however Aricept likely to make a significant clinical difference.  HPI:  Emma Yang is a 85 y.o. female here as requested by provider Reymundo Poll, MD for "dizzy spells and mini strokes".  Here with her daughter who provides much information.  PMHx Afib, HTN, benign positional vertigo, TIA, long-term anticoagulant use, hypothyroidism, low back pain, repeated falls, depression, orthostatic hypotension, recurrent cystitis, generalized weakness.  She had a fall last summer. Years ago she was diagnosed with an autonomic disorder of unknown type . She has dizzy spells for years. She has sun sensitivity. She has done well after last cardioversion. Last summer she had a bad fall. She moved to [facility] in assisted living. Physically she is getting stronger. She is afraid when she is dizzy. Happens often in the morning when she wakes up. She eats quickly. She tries to go to exercise class but the dizziness. More prevalent in the mornings. May have an episode during the day as well. She has room spinning. Happens when rolls over in bed. Or moves head. The epley maneuvers helped in the past, way back. The epley maneuvers are helping her. She has hearing loss.  She has vertigo. Snores very heavily per daughter. She has abnormal movements at night, been bruised at night. She is very tired and takes naps. She has sharp and intense pain, stabs in the head, been happening daily. Can happen -2x a day, brief and severe, short. Mostly at night when sleeping. It can be several jabs in a row. Migraines growing up.   Reviewed notes, labs and imaging from outside physicians, which showed: Reviewed notes from Dr. Fredderick Phenix.  Patient has been seen multiple time for evaluation of continued dizziness.  Patient is homebound due to severe cognitive impairment, deconditioning, poor vision and difficulty walking without assistance.  Patient notes that when she stands up and turns her head a certain when she becomes dizzy with an sensation of room spinning.  She does have orthostasis and they recently discontinued propranolol.  No recent falls however she has fallen multiple times in the past.  She does have atrial fibrillation and currently asymptomatic without palpitations or tachycardia exertional dyspnea or other symptoms.  There was no breakthrough heart rate increase following discontinuation of the metoprolol.  She is however continued another blood pressure medications.  Dr. Fredderick Phenix thought that the timing and nature of symptoms suggest BPPV, and she was sent to physical therapy who has been performing Epley maneuvers on her.  He discovered she was still orthostatic and they also discontinued her amlodipine.  They are sending her to a local cardiologist for atrial fibrillation.  Continuing her long-term anticoagulation.  She is been noted to have lightheadedness with walking.  Evident when she works with physical therapy.  She did note tremor of her hands while weaning Lexapro which may be the cause.  She remains clinically euthyroid.  Labs were taken April 13, 2018 and include unremarkable CMP with BUN 16 and creatinine 0.8, hemoglobin A1c 5.5, TSH 3.67, B12 359, folate 5.9,  unremarkable CBC.  Review of Systems: Patient complains of symptoms per HPI as well as the following symptoms: Fatigue, blurred vision, easy bruising, easy bleeding, memory loss, confusion, headache, weakness, dizziness, tremor, snoring, hearing loss, incontinence, joint pain, aching muscles, decreased energy. Pertinent negatives and positives per HPI. All others negative.   Social History   Socioeconomic History   Marital status: Widowed    Spouse name: Not on file   Number of children: 3   Years of education: Not on file   Highest education level: Not on file  Occupational History   Not on file  Social Needs   Financial resource strain: Not on file   Food insecurity    Worry: Not on file    Inability: Not on file   Transportation needs    Medical: Not on file    Non-medical: Not on file  Tobacco Use   Smoking status: Former Smoker    Quit date: 04/20/1967    Years since quitting: 51.6   Smokeless tobacco: Never Used  Substance and Sexual Activity   Alcohol use: Not Currently    Comment: quit 09/2017   Drug use: Never   Sexual activity: Not on file  Lifestyle   Physical activity    Days per week: Not on file    Minutes per session: Not on file   Stress: Not on file  Relationships   Social connections    Talks on phone: Not on file    Gets together: Not on file    Attends religious service: Not on file    Active member of club or organization: Not on file    Attends meetings of clubs or organizations: Not on file    Relationship status: Not on file   Intimate partner violence    Fear of current or ex partner: Not on file    Emotionally abused: Not on file    Physically abused: Not on file    Forced sexual activity: Not on file  Other Topics Concern   Not on file  Social History Narrative   Lives at [facility].  Widowed.  Education some college.  Children 3.      Family History  Problem Relation Age of Onset   Hypotension Neg Hx      Past Medical History:  Diagnosis Date   A-fib ([place])    POST CARDIOVERSION   Allergic rhinitis    Anemia    Anticoagulant long-term use    Anxiety associated with depression    Atrial flutter (HCC)    Autonomic disorder    ANS   BPPV (benign paroxysmal positional vertigo)    UNSPECIFIED LATERALITY   Choreoathetosis    Depression    Dizziness    Falls    Fluency disorder following cerebral infarction    General weakness    DEBILITY   GERD (gastroesophageal reflux disease)    Hypertension    Hypomagnesemia    Hyponatremia    Hypothyroid    Low back pain without sciatica    UNSPECIFIED BACK PAIN LATERALITY, UNSPECIFIED CHRONICITY   MDD (major depressive disorder), recurrent episode, moderate (HCC)    Orthostatic hypotension    Rash    Recurrent cystitis  Recurrent falls    Seborrheic dermatitis    TIA (transient ischemic attack)    Vasomotor rhinitis    Vertigo     Patient Active Problem List   Diagnosis Date Noted   Vascular dementia without behavioral disturbance ([place]) 11/28/2018   Vertigo    TIA (transient ischemic attack)    Orthostatic hypotension    Hypothyroid    Hypotension    Hypertension    Falls    Dizziness    Depression    BPPV (benign paroxysmal positional vertigo)    Autonomic disorder    A-fib [facility])     Past Surgical History:  Procedure Laterality Date   ABLATION     cardiac   CARDIOVERSION  2018   KNEE SURGERY     WRIST SURGERY      Current Outpatient Medications  Medication Sig Dispense Refill   apixaban (ELIQUIS) 2.5 MG TABS tablet Take 2.5 mg by mouth 2 (two) times daily.      carbamide peroxide (GNP EARWAX REMOVAL KIT) 6.5 % OTIC solution 2 drops. Into the affected ear once per week as needed for ear wax impaction.     Cholecalciferol (VITAMIN D-3 PO) Take by mouth. 2000u by mouth daily     Dentifrices (BIOTENE DRY MOUTH) PSTE Place onto teeth. Provide to use as toothpaste  once daily.     dofetilide (TIKOSYN) 250 MCG capsule Take 250 mcg by mouth 2 (two) times daily.     ferrous sulfate 325 (65 FE) MG tablet Take 325 mg by mouth 2 (two) times daily with a meal.      fluticasone (FLONASE) 50 MCG/ACT nasal spray Place 1 spray into both nostrils daily.      ketoconazole (NIZORAL) 2 % shampoo Apply 1 application topically 2 (two) times a week.     levothyroxine (SYNTHROID, LEVOTHROID) 25 MCG tablet Take 25 mcg by mouth daily before breakfast.      losartan (COZAAR) 50 MG tablet Take 50 mg by mouth at bedtime.      meclizine (ANTIVERT) 25 MG tablet Take 25 mg by mouth 2 (two) times daily.     nitrofurantoin, macrocrystal-monohydrate, (MACROBID) 100 MG capsule Take 100 mg by mouth every 12 (twelve) hours. For 7 days.     omeprazole (PRILOSEC) 20 MG capsule Take 20 mg by mouth daily.      oxybutynin (DITROPAN) 5 MG tablet Take 5 mg by mouth every 6 (six) hours.     UNABLE TO FIND Place 3 drops into both ears 3 (three) times a week. Med Name: GNP Mineral Oil     vitamin B-12 (CYANOCOBALAMIN) 1000 MCG tablet Take 1,000 mcg by mouth daily.      No current facility-administered medications for this visit.     Allergies as of 11/20/2018 - Review Complete 11/20/2018  Allergen Reaction Noted   Formaldehyde Itching 03/07/2018   Other Other (See Comments) 03/14/2018   Levaquin [levofloxacin]  07/03/2018   Tape  07/03/2018    Vitals: BP (!) 145/71 (BP Location: Left Arm, Patient Position: Sitting)    Pulse 98    Temp (!) 97.5 F (36.4 C) Comment: 97.7 daughter. both taken by front staff.   Ht _0  (1.676 m)    Wt 137 lb (62.1 kg)    BMI 22.11 kg/m  Last Weight:  Wt Readings from Last 1 Encounters:  11/20/18 137 lb (62.1 kg)   Last Height:   Ht Readings from Last 1 Encounters:  11/20/18 _1  (1.676 m)  Physical exam: Exam: Gen: NAD, conversant,                 CV: irregular, no MRG. No Carotid Bruits. 1+ distal LE pitting peripheral edema,  warm, nontender Eyes: Conjunctivae clear without exudates or hemorrhage  Neuro: Detailed Neurologic Exam  Speech:    Speech is normal; fluent and spontaneous with normal comprehension.  Cognition:    The patient is oriented to person, month, year.    recent and remote memory impaired;     language fluent;     Impaired attention, concentration, fund of knowledge Cranial Nerves:    The pupils are equal, round, attempted funduscopy could not visualize due to small pupils.  Visual fields are full to finger confrontation. Extraocular movements are intact. Trigeminal sensation is intact and the muscles of mastication are normal. The face is symmetric (some possible swelling of the right eyelid). The palate elevates in the midline. Hearing impaired. Voice is normal. Shoulder shrug is normal. The tongue has normal motion without fasciculations.   Coordination:    No dysmetria noted  Gait:   Slightly wide based and shuffling, slight stooped and decreased arm swing with en bloc turning  Motor Observation:     no involuntary movements noted. Tone:    Normal muscle tone.      Strength:  prox weaness in LE 3+/5, upper extremities 4+/5, symmetrical        Strength is V/V in the upper and lower limbs.      Sensation: intact to LT     Reflex Exam:  DTR's:    Absent AJs otherwise deep tendon reflexes in the upper and lower extremities are brisk bilaterally.   Toes:    The toes are downgoing bilaterally.   Clonus:    Clonus is absent.    Assessment/Plan:   85 y.o. female here as requested by provider Reymundo Poll, MD for "dizzy spells and mini strokes" also memory loss, b12 deficiency.  Here with her daughter who provides much information.  PMHx Afib and aflutter, HTN, benign positional vertigo, TIA, long-term anticoagulant use, hypothyroidism, low back pain, repeated falls, depression, orthostatic hypotension, recurrent cystitis, generalized weakness.  -Likely vascular dementia she has  extensive confluent advanced chronic microvascular disease.  We discussed Aricept at this time family will consider.  I do not think it will make a considerable clinical difference.  - discussed adding Asa due to her extensive small vessel disease, she has a lot of nose ebleeds, imbalance and dizziness with high risk of falls decided not to place on asa due to increased bleeding risk and fall risk.  -Patient's primary physician has been trying to decrease her high blood pressure medication due to orthostatic hypotension.  Today she is again orthostatic.  Sitting 131/76 pulse 63, standing 108/63 pulse 67, standing for 2 minutes 93/60 pulse 66.  She was diagnosed with an autonomic disorder in the past unknown what kind but possibly may be related to her orthostasis.   -Discussed the use of midodrine and fludrocortisone and others for blood pressure support.  However also discussed risk of hypertension especially supine.  I will talk to her PCP Reymundo Poll and see what he thinks.  We may consider compression hose and discontinuing all of her blood pressure medications before going this route.  -Patient's dizziness may be multifactorial including orthostatic hypotension, benign positional vertigo or other central vertigo such as vertigo associated with migraines.  But we need to consider all possibilities.  She has right  ear pain/stuffiness, will refer her to Dr. Ernesto Rutherford for evaluation of this and hearing loss possibly Mnire's disease.  -She wakes up dizzy every morning and snores heavily, discussed sleep evaluation and the risks of obstructive sleep apnea if untreated, they would like to hold off on that right now.   Orders Placed This Encounter  Procedures   B12 and Folate Panel   Methylmalonic acid, serum   Vitamin B1   Homocysteine    Cc: Reymundo Poll, MD,    Sarina Ill, MD  [facility] [address] [place], [place] [address]  Phone  [phone] Fax [phone]  A total of 25 minutes was spent face-to-face with this patient. Over half this time was spent on counseling patient on the  1. Vascular dementia without behavioral disturbance (HCC)    diagnosis and different diagnostic and therapeutic options, counseling and coordination of care, risks ans benefits of management, compliance, or risk factor reduction and education.

## 2018-11-20 NOTE — Patient Instructions (Signed)
Can consider Aricept (Donepezil) but likely will not make a large clinical difference and has side effects   Vascular Dementia Dementia is a condition in which a person has problems with thinking, memory, and behavior that are severe enough to interfere with daily life. Vascular dementia is a type of dementia. It results from brain damage that is caused by the brain not getting enough blood. This condition may also be called vascular cognitive impairment. What are the causes? Vascular dementia is caused by conditions that lessen blood flow to the brain. Common causes of this condition include:  Multiple small strokes. These may happen without symptoms (silent stroke).  Major stroke.  Damage to small blood vessels in the brain (cerebral small vessel disease). What increases the risk? The following factors may make you more likely to develop this condition:  Having had a stroke.  Having high blood pressure (hypertension) or high cholesterol.  Having a disease that affects the heart or blood vessels.  Smoking.  Having diabetes.  Having metabolic syndrome.  Being obese.  Not being active.  Having depression.  Being over age 56. What are the signs or symptoms? Symptoms can vary from one person to another. Symptoms may be mild or severe depending on the amount of damage and which parts of the brain have been affected. Symptoms may begin suddenly or may develop slowly. Mental symptoms of vascular dementia may include:  Confusion.  Memory problems.  Poor attention and concentration.  Trouble understanding speech.  Depression.  Personality changes.  Trouble recognizing familiar people.  Agitation or aggression.  Paranoia.  Delusions or hallucinations. Physical symptoms of vascular dementia may include:  Weakness.  Poor balance.  Loss of bladder or bowel control (incontinence).  Unsteady walking (gait).  Speaking problems. Behavioral symptoms of vascular  dementia may include:  Getting lost in familiar places.  Problems with planning and judgment.  Trouble following instructions.  Social problems.  Emotional outbursts.  Trouble with daily activities and self-care.  Problems handling money. Symptoms may remain stable, or they may get worse over time. Symptoms of vascular dementia may be similar to those of Alzheimer's disease. The two conditions can occur together (mixed dementia). How is this diagnosed? Your health care provider will consider your medical history and symptoms or changes that are reported by friends and family. Your health care provider will do a physical exam and may order lab tests or other tests that check brain and nervous system function. Tests that may be done include:  Blood tests.  Brain imaging tests.  Tests of movement, speech, and other daily activities (neurological exam).  Tests of memory, thinking, and problem-solving (neuropsychological or neurocognitive testing). There is not a specific test to diagnose vascular dementia. Diagnosis may involve several specialists. These may include:  A health care provider who specializes in the brain and nervous system (neurologist).  A health provider who specializes in understanding how problems in the brain can alter behavior and cognitive function (neuropsychologist). How is this treated? There is no cure for vascular dementia. Brain damage that has already occurred cannot be reversed. Treatment depends on:  How severe the condition is.  Which parts of your brain have been affected.  Your overall health. Treatment measures aim to:  Treat the underlying cause of vascular dementia and manage risk factors. This may include: ? Controlling blood pressure. ? Lowering cholesterol. ? Treating diabetes. ? Quitting smoking. ? Losing weight or maintaining a healthy weight. ? Eating a healthy, balanced diet. ? Getting regular  exercise.  Manage symptoms.   Prevent further brain damage.  Improve the person's health and quality of life. Treatment for dementia may involve a team of health care providers, including:  A neurologist.  A provider who specializes in disorders of the mind (psychiatrist).  A provider who specializes in helping people learn daily living skills (occupational therapist).  A provider who focuses on speech and language changes (Doctor, general practicespeech pathologist).  A heart specialist (cardiologist).  A provider who helps people learn how to manage physical changes, such as movement and walking (exercise physiologist or physical therapist). Follow these instructions at home: Lifestyle  People with vascular dementia may need regular help at home or daily care from a family member or home health care worker. Home care for a person with vascular dementia depends on what caused the condition and how severe the symptoms are. General guidelines for caregivers include:  Help the person with dementia remember people, appointments, and daily activities.  Help the person with dementia manage his or her medicines.  Help family and friends learn about ways to communicate with the person with dementia.  Create a safe living space to reduce the risk of injury or falls.  Find a support group to help caregivers and family cope with the effects of dementia.  General instructions  Help the person take over-the-counter and prescription medicines only as told by the health care provider.  Follow the health care provider's instructions for treating the condition that caused the dementia.  Make sure the person keeps all follow-up visits as told by the health care provider. This is important. Contact a health care provider if:  A fever develops.  New behavioral problems develop.  Problems with swallowing develop.  Confusion gets worse.  Sleepiness gets worse. Get help right away if:  Loss of consciousness occurs.  There is a sudden  loss of speech, balance, or thinking ability.  New numbness or paralysis occurs.  Sudden, severe headache occurs.  Vision is lost or suddenly gets worse in one or both eyes. Summary  Vascular dementia is a type of dementia. It results from brain damage that is caused by the brain not getting enough blood.  Vascular dementia is caused by conditions that lessen blood flow to the brain. Common causes of this condition include stroke and damage to small blood vessels in the brain.  Treatment focuses on treating the underlying cause of vascular dementia and managing any risk factors.  People with vascular dementia may need regular help at home or daily care from a family member or home health care worker.  Contact a health care provider if you or your caregiver notice any new symptoms. This information is not intended to replace advice given to you by your health care provider. Make sure you discuss any questions you have with your health care provider. Document Released: 03/26/2002 Document Revised: 01/04/2018 Document Reviewed: 01/05/2018 Elsevier Patient Education  2020 [employer]

## 2018-11-28 DIAGNOSIS — F0150 Vascular dementia without behavioral disturbance: Secondary | ICD-10-CM | POA: Insufficient documentation

## 2019-02-22 ENCOUNTER — Emergency Department (HOSPITAL_COMMUNITY)
Admission: EM | Admit: 2019-02-22 | Discharge: 2019-02-22 | Disposition: A | Discharge status: Home / Self Care | Payer: Medicare Other | Attending: Emergency Medicine | Admitting: Emergency Medicine

## 2019-02-22 ENCOUNTER — Other Ambulatory Visit: Payer: Self-pay

## 2019-02-22 ENCOUNTER — Emergency Department (HOSPITAL_COMMUNITY): Payer: Medicare Other

## 2019-02-22 DIAGNOSIS — Z87891 Personal history of nicotine dependence: Secondary | ICD-10-CM | POA: Insufficient documentation

## 2019-02-22 DIAGNOSIS — Z79899 Other long term (current) drug therapy: Secondary | ICD-10-CM | POA: Insufficient documentation

## 2019-02-22 DIAGNOSIS — I10 Essential (primary) hypertension: Secondary | ICD-10-CM | POA: Insufficient documentation

## 2019-02-22 DIAGNOSIS — Z7901 Long term (current) use of anticoagulants: Secondary | ICD-10-CM | POA: Diagnosis not present

## 2019-02-22 DIAGNOSIS — I4891 Unspecified atrial fibrillation: Secondary | ICD-10-CM | POA: Diagnosis not present

## 2019-02-22 DIAGNOSIS — E039 Hypothyroidism, unspecified: Secondary | ICD-10-CM | POA: Diagnosis not present

## 2019-02-22 DIAGNOSIS — R0602 Shortness of breath: Secondary | ICD-10-CM | POA: Insufficient documentation

## 2019-02-22 DIAGNOSIS — R42 Dizziness and giddiness: Secondary | ICD-10-CM | POA: Diagnosis present

## 2019-02-22 LAB — COMPREHENSIVE METABOLIC PANEL
ALT: 14 U/L (ref 0–44)
AST: 15 U/L (ref 15–41)
Albumin: 3.3 g/dL — ABNORMAL LOW (ref 3.5–5.0)
Alkaline Phosphatase: 36 U/L — ABNORMAL LOW (ref 38–126)
Anion gap: 8 (ref 5–15)
BUN: 16 mg/dL (ref 8–23)
CO2: 22 mmol/L (ref 22–32)
Calcium: 8.6 mg/dL — ABNORMAL LOW (ref 8.9–10.3)
Chloride: 102 mmol/L (ref 98–111)
Creatinine, Ser: 0.98 mg/dL (ref 0.44–1.00)
GFR calc Af Amer: >60 mL/min (ref >60)
GFR calc non Af Amer: 53 mL/min — ABNORMAL LOW (ref >60)
Glucose, Bld: 106 mg/dL — ABNORMAL HIGH (ref 70–99)
Potassium: 4.2 mmol/L (ref 3.5–5.1)
Sodium: 132 mmol/L — ABNORMAL LOW (ref 135–145)
Total Bilirubin: 0.8 mg/dL (ref 0.3–1.2)
Total Protein: 6 g/dL — ABNORMAL LOW (ref 6.5–8.1)

## 2019-02-22 LAB — CBC
HCT: 37.5 % (ref 36.0–46.0)
Hemoglobin: 12.7 g/dL (ref 12.0–15.0)
MCH: 31.3 pg (ref 26.0–34.0)
MCHC: 33.9 g/dL (ref 30.0–36.0)
MCV: 92.4 fL (ref 80.0–100.0)
Platelets: 289 10*3/uL (ref 150–400)
RBC: 4.06 MIL/uL (ref 3.87–5.11)
RDW: 14.3 % (ref 11.5–15.5)
WBC: 6.3 10*3/uL (ref 4.0–10.5)
nRBC: 0 % (ref 0.0–0.2)

## 2019-02-22 IMAGING — DX DG CHEST 1V PORT
1 series · 1 of 1 positions shown · non-contrast
Comparison: None

CLINICAL DATA: Tachycardia and weakness.

EXAM:
PORTABLE CHEST 1 VIEW

[chest ap]
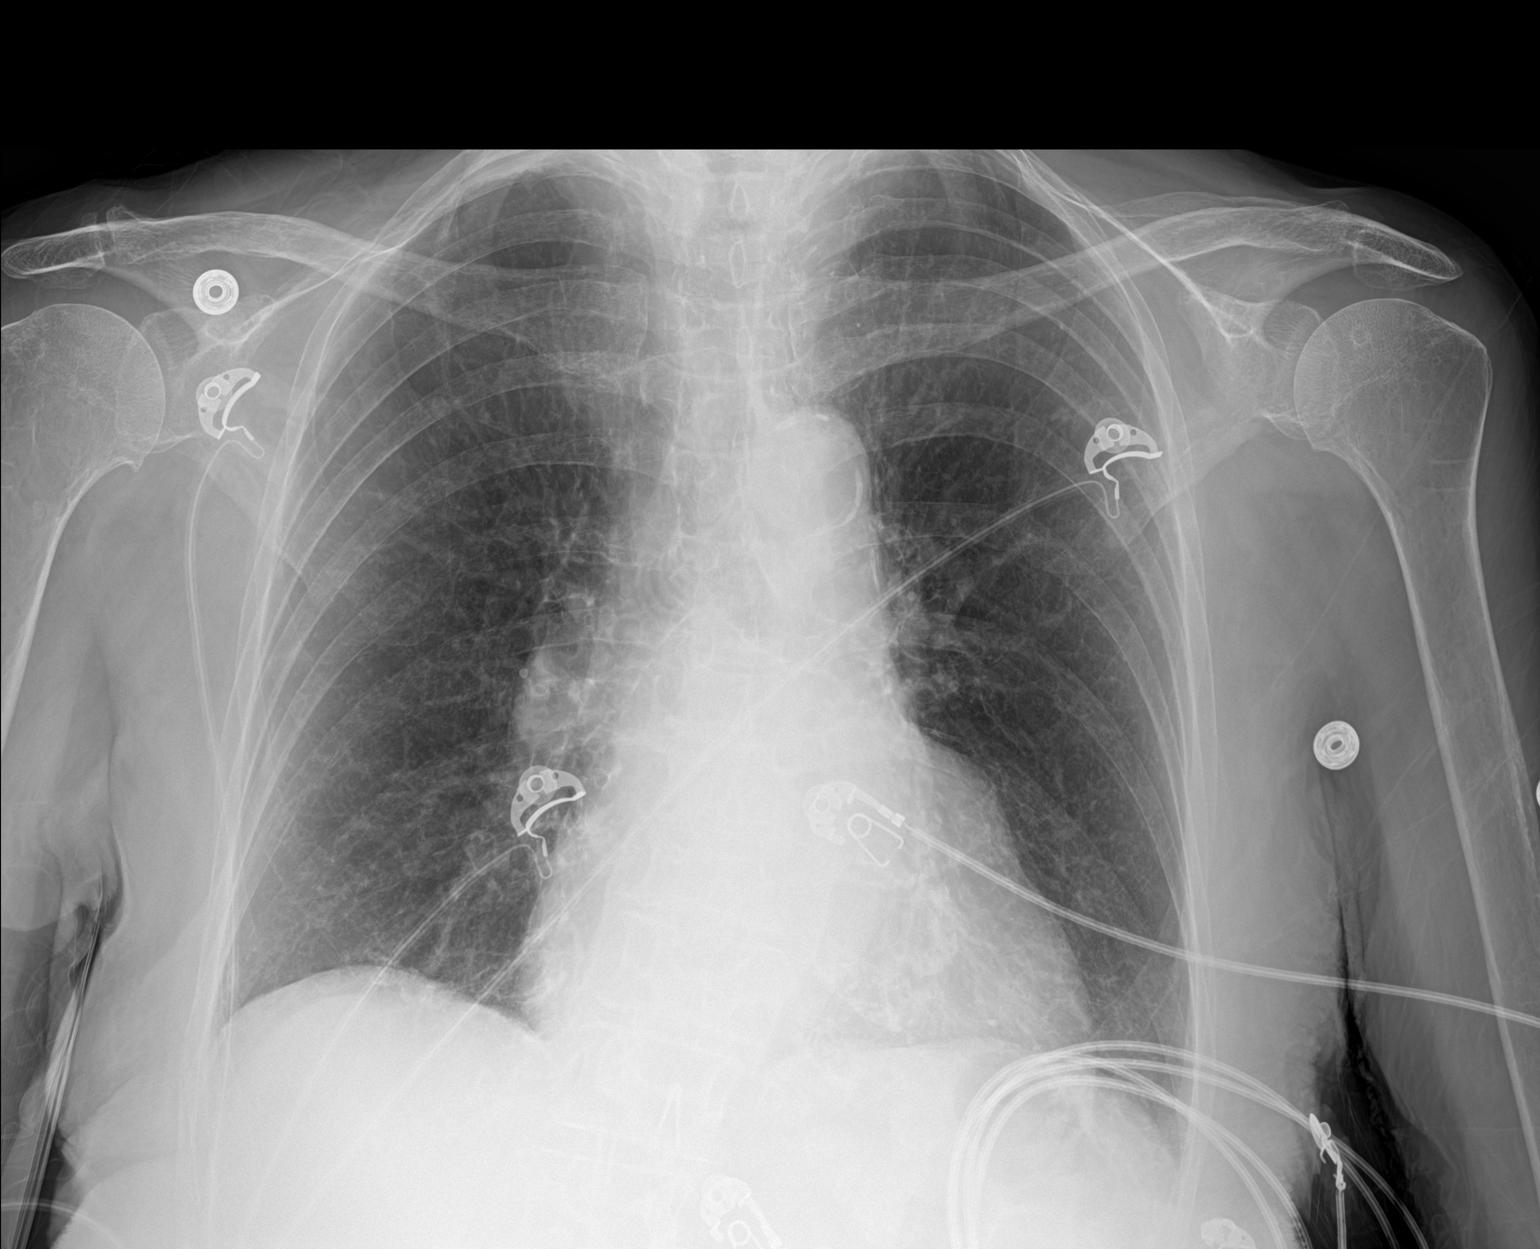

[1 of 1 positions shown; findings below may reference images not displayed]

FINDINGS: Cardiomegaly noted.

There is no evidence of focal airspace disease, pulmonary edema,
suspicious pulmonary nodule/mass, pleural effusion, or pneumothorax.

No acute bony abnormalities are identified.
IMPRESSION: Cardiomegaly without evidence of acute cardiopulmonary disease.

## 2019-02-22 MED ORDER — DILTIAZEM LOAD VIA INFUSION
20.0000 mg | Freq: Once | INTRAVENOUS | Status: AC
  Administered 2019-02-22: 20 mg via INTRAVENOUS
  Filled 2019-02-22: qty 20

## 2019-02-22 MED ORDER — DILTIAZEM HCL-DEXTROSE 125-5 MG/125ML-% IV SOLN (PREMIX)
5.0000 mg/h | INTRAVENOUS | Status: DC
  Administered 2019-02-22: 5 mg/h via INTRAVENOUS
  Filled 2019-02-22: qty 125

## 2019-02-22 MED ORDER — SODIUM CHLORIDE 0.9 % IV SOLN
INTRAVENOUS | Status: DC
  Administered 2019-02-22: 10 mL via INTRAVENOUS

## 2019-02-22 NOTE — ED Triage Notes (Signed)
Pt here via ems from [facility] with co dizziness that started yesterday and SVT that is resolved. Pt has moments of possible dementia. Pt bp has also been going up and down today. SVT with ems @ 170. Denies sob & chest pain. 6mg  of adenosine given and hr converted but when back to svt so ems gave 12 mg of adenosine. After given the 12mg  of adenosine EMS reported that the pt appeared to shake all over for abt 15 secs. There was no postictal stage. Pt went back to NSR. During transport to hosp the pt went back into SVT a 3rd time and then converted herself into afib 90-110.

## 2019-02-22 NOTE — Discharge Instructions (Signed)
It was our pleasure to provide your ER care today - we hope that you feel better.  Rest. Drink adequate fluids. Continue home meds.   Follow up with your doctor/cardiologist tomorrow - call office tomorrow AM to check in, discuss symptoms and how you are feeling, and arrange follow up plan.   Return to ER right away if worse, new symptoms, fevers, persistent or worsening fast heart beat, persistent or recurrent chest pain, weak/fainting, or other concern.

## 2019-02-22 NOTE — ED Provider Notes (Addendum)
[facility] EMERGENCY DEPARTMENT Provider Note   CSN: [id number] Arrival date & time: 02/22/19  1642     History   Chief Complaint Chief Complaint  Patient presents with  . SVT  . Dizziness    HPI Emma Yang is a 85 y.o. female.     Patient with hx chronic, recurrent afib, c/o lightheadedness, and intermittently palpitations for past 3 days. Symptoms acute onset, moderate, persistent, recurrent, lasting minutes to hours at a time. Ems noted narrow complex tachycardia, hr 170s, gave adenosine in route. In ED in rapid afib. Pt denies current or recent chest pain or discomfort. Mild sob w exertion. No increased swelling. Denies cough or uri symptoms. No known covid + exposure. Compliant w home meds, denies any recent change. No syncope, trauma or fall.   The history is provided by the patient and the EMS personnel.  Dizziness Associated symptoms: palpitations and shortness of breath   Associated symptoms: no chest pain, no diarrhea, no headaches and no vomiting     Past Medical History:  Diagnosis Date  . A-fib (HCC)    POST CARDIOVERSION  . Allergic rhinitis   . Anemia   . Anticoagulant long-term use   . Anxiety associated with depression   . Atrial flutter ([place])   . Autonomic disorder    ANS  . BPPV (benign paroxysmal positional vertigo)    UNSPECIFIED LATERALITY  . Choreoathetosis   . Depression   . Dizziness   . Falls   . Fluency disorder following cerebral infarction   . General weakness    DEBILITY  . GERD (gastroesophageal reflux disease)   . Hypertension   . Hypomagnesemia   . Hyponatremia   . Hypothyroid   . Low back pain without sciatica    UNSPECIFIED BACK PAIN LATERALITY, UNSPECIFIED CHRONICITY  . MDD (major depressive disorder), recurrent episode, moderate ([place])   . Orthostatic hypotension   . Rash   . Recurrent cystitis   . Recurrent falls   . Seborrheic dermatitis   . TIA (transient ischemic attack)   . Vasomotor  rhinitis   . Vertigo     Patient Active Problem List   Diagnosis Date Noted  . Vascular dementia without behavioral disturbance ([place]) 11/28/2018  . Vertigo   . TIA (transient ischemic attack)   . Orthostatic hypotension   . Hypothyroid   . Hypotension   . Hypertension   . Falls   . Dizziness   . Depression   . BPPV (benign paroxysmal positional vertigo)   . Autonomic disorder   . A-fib [facility])     Past Surgical History:  Procedure Laterality Date  . ABLATION     cardiac  . CARDIOVERSION  2018  . KNEE SURGERY    . WRIST SURGERY       OB History   No obstetric history on file.      Home Medications    Prior to Admission medications   Medication Sig Start Date End Date Taking? Authorizing Provider  apixaban (ELIQUIS) 2.5 MG TABS tablet Take 2.5 mg by mouth 2 (two) times daily.     [provider]  carbamide peroxide (GNP EARWAX REMOVAL KIT) 6.5 % OTIC solution 2 drops. Into the affected ear once per week as needed for ear wax impaction.    [provider]  Cholecalciferol (VITAMIN D-3 PO) Take by mouth. 2000u by mouth daily    [provider]  Dentifrices (BIOTENE DRY MOUTH) PSTE Place onto teeth. Provide  to use as toothpaste once daily.    [provider]  dofetilide (TIKOSYN) 250 MCG capsule Take 250 mcg by mouth 2 (two) times daily.    [provider]  ferrous sulfate 325 (65 FE) MG tablet Take 325 mg by mouth 2 (two) times daily with a meal.     [provider]  fluticasone (FLONASE) 50 MCG/ACT nasal spray Place 1 spray into both nostrils daily.  06/02/18   [provider]  ketoconazole (NIZORAL) 2 % shampoo Apply 1 application topically 2 (two) times a week.    [provider]  levothyroxine (SYNTHROID, LEVOTHROID) 25 MCG tablet Take 25 mcg by mouth daily before breakfast.  07/17/18   [provider]  losartan (COZAAR) 50 MG tablet Take 50 mg by mouth at bedtime.  01/16/18   [provider]  meclizine (ANTIVERT) 25 MG tablet Take 25 mg by mouth 2 (two) times daily.    [provider]  nitrofurantoin, macrocrystal-monohydrate, (MACROBID) 100 MG capsule Take 100 mg by mouth every 12 (twelve) hours. For 7 days.    [provider]  omeprazole (PRILOSEC) 20 MG capsule Take 20 mg by mouth daily.     [provider]  oxybutynin (DITROPAN) 5 MG tablet Take 5 mg by mouth every 6 (six) hours.    Welford Roche, NP  UNABLE TO FIND Place 3 drops into both ears 3 (three) times a week. Med Name: [place]    [provider]  vitamin B-12 (CYANOCOBALAMIN) 1000 MCG tablet Take 1,000 mcg by mouth daily.     [provider]    Family History Family History  Problem Relation Age of Onset  . Hypotension Neg Hx     Social History Social History   Tobacco Use  . Smoking status: Former Smoker    Quit date: 04/20/1967    Years since quitting: 51.8  . Smokeless tobacco: Never Used  Substance Use Topics  . Alcohol use: Not Currently    Comment: quit 09/2017  . Drug use: Never     Allergies   Formaldehyde, Other, Levaquin [levofloxacin], and Tape   Review of Systems Review of Systems  Constitutional: Negative for chills and fever.  HENT: Negative for sore throat.   Eyes: Negative for redness.  Respiratory: Positive for shortness of breath. Negative for cough.   Cardiovascular: Positive for palpitations. Negative for chest pain and leg swelling.  Gastrointestinal: Negative for abdominal pain, diarrhea and vomiting.  Genitourinary: Negative for dysuria and flank pain.  Musculoskeletal: Negative for back pain and neck pain.  Skin: Negative for rash.  Neurological: Positive for dizziness and light-headedness. Negative for headaches.  Hematological: Does not bruise/bleed easily.  Psychiatric/Behavioral: Negative for confusion.     Physical Exam Updated Vital Signs BP (!) 140/92 (BP Location: Right Arm)   Pulse 71    Temp 98.1 F (36.7 C) (Oral)   Resp 17   SpO2 98%   Physical Exam Vitals signs and nursing note reviewed.  Constitutional:      Appearance: Normal appearance. She is well-developed.  HENT:     Head: Atraumatic.     Nose: Nose normal.     Mouth/Throat:     Mouth: Mucous membranes are moist.  Eyes:     General: No scleral icterus.    Conjunctiva/sclera: Conjunctivae normal.     Pupils: Pupils are equal, round, and reactive to light.  Neck:     Musculoskeletal: Normal range of motion and neck supple.  No neck rigidity or muscular tenderness.     Trachea: No tracheal deviation.  Cardiovascular:     Rate and Rhythm: Tachycardia present. Rhythm irregular.     Pulses: Normal pulses.     Heart sounds: Normal heart sounds. No murmur. No friction rub. No gallop.   Pulmonary:     Effort: Pulmonary effort is normal. No respiratory distress.     Breath sounds: Normal breath sounds.  Abdominal:     General: Bowel sounds are normal. There is no distension.     Palpations: Abdomen is soft.     Tenderness: There is no abdominal tenderness. There is no guarding.  Genitourinary:    Comments: No cva tenderness.  Musculoskeletal:        General: No swelling or tenderness.     Right lower leg: No edema.     Left lower leg: No edema.  Skin:    General: Skin is warm and dry.     Findings: No rash.  Neurological:     Mental Status: She is alert.     Comments: Alert, speech normal. Motor/sens grossly intact.   Psychiatric:        Mood and Affect: Mood normal.      ED Treatments / Results  Labs (all labs ordered are listed, but only abnormal results are displayed) Results for orders placed or performed during the hospital encounter of 02/22/19  CBC  Result Value Ref Range   WBC 6.3 4.0 - 10.5 K/uL   RBC 4.06 3.87 - 5.11 MIL/uL   Hemoglobin 12.7 12.0 - 15.0 g/dL   HCT 37.5 36.0 - 46.0 %   MCV 92.4 80.0 - 100.0 fL   MCH 31.3 26.0 - 34.0 pg   MCHC 33.9 30.0 - 36.0 g/dL   RDW 14.3 11.5  - 15.5 %   Platelets 289 150 - 400 K/uL   nRBC 0.0 0.0 - 0.2 %  CMET  Result Value Ref Range   Sodium 132 (L) 135 - 145 mmol/L   Potassium 4.2 3.5 - 5.1 mmol/L   Chloride 102 98 - 111 mmol/L   CO2 22 22 - 32 mmol/L   Glucose, Bld 106 (H) 70 - 99 mg/dL   BUN 16 8 - 23 mg/dL   Creatinine, Ser 0.98 0.44 - 1.00 mg/dL   Calcium 8.6 (L) 8.9 - 10.3 mg/dL   Total Protein 6.0 (L) 6.5 - 8.1 g/dL   Albumin 3.3 (L) 3.5 - 5.0 g/dL   AST 15 15 - 41 U/L   ALT 14 0 - 44 U/L   Alkaline Phosphatase 36 (L) 38 - 126 U/L   Total Bilirubin 0.8 0.3 - 1.2 mg/dL   GFR calc non Af Amer 53 (L) >60 mL/min   GFR calc Af Amer >60 >60 mL/min   Anion gap 8 5 - 15   Xr Chest Portable  Result Date: 02/22/2019 CLINICAL DATA:  Tachycardia and weakness. EXAM: PORTABLE CHEST 1 VIEW COMPARISON:  None FINDINGS: Cardiomegaly noted. There is no evidence of focal airspace disease, pulmonary edema, suspicious pulmonary nodule/mass, pleural effusion, or pneumothorax. No acute bony abnormalities are identified. IMPRESSION: Cardiomegaly without evidence of acute cardiopulmonary disease. Electronically Signed   By: Margarette Canada M.D.   On: 02/22/2019 18:31    EKG EKG Interpretation  Date/Time:  Thursday February 22 2019 18:46:31 EST Ventricular Rate:  100 PR Interval:    QRS Duration: 93 QT Interval:  418 QTC Calculation: 467 R Axis:   79 Text Interpretation: Sinus  tachycardia Premature atrial complexes Confirmed by Lajean Saver [phone]) on 02/22/2019 6:55:13 PM   Radiology Xr Chest Portable  Result Date: 02/22/2019 CLINICAL DATA:  Tachycardia and weakness. EXAM: PORTABLE CHEST 1 VIEW COMPARISON:  None FINDINGS: Cardiomegaly noted. There is no evidence of focal airspace disease, pulmonary edema, suspicious pulmonary nodule/mass, pleural effusion, or pneumothorax. No acute bony abnormalities are identified. IMPRESSION: Cardiomegaly without evidence of acute cardiopulmonary disease. Electronically Signed   By: Margarette Canada  M.D.   On: 02/22/2019 18:31    Procedures Procedures (including critical care time)  Medications Ordered in ED Medications  0.9 %  sodium chloride infusion (has no administration in time range)  diltiazem (CARDIZEM) 1 mg/mL load via infusion 20 mg (has no administration in time range)    And  diltiazem (CARDIZEM) 125 mg in dextrose 5% 125 mL (1 mg/mL) infusion (has no administration in time range)     Initial Impression / Assessment and Plan / ED Course  I have reviewed the triage vital signs and the nursing notes.  Pertinent labs & imaging results that were available during my care of the patient were reviewed by me and considered in my medical decision making (see chart for details).  Continuous pulse ox and monitor. o2 [place]. Stat labs and imaging. Ecg.   Reviewed nursing notes and prior charts for additional history.   Patient in afib, hr 150s, cardizem bolus/gtt.   Labs reviewed/interpreted by me - k normal.   Xray reviewed/interpreted by me - no pna.   Recheck pt - no chest pain. Sinus rhythm.   Discussed disposition plan with pt/fam. Pt indicates feels fine, no current or recent chest discomfort of any sort, states breathing fine, no palpitations, no faintness or dizziness - states prefers not to stay in hospital, requests d/c.   Patient observed off cardizem gtt - remains in sinus rhythm, rate controlled. Taking po fluids. Pt continues to indicate feels ready for d/c. Note than despite pulse rate recorded in chart in 40s, patients heart rate/pulse for the 2.5 hours preceding discharge in the 90-100 range.  CRITICAL CARE RE: atrial fibrillation with rapid ventricular response/symptomatic w dyspnea, requiring iv cardizem bolus/gtt/titration for rate control.  Performed by: Mirna Mires Total critical care time: 35 minutes Critical care time was exclusive of separately billable procedures and treating other patients. Critical care was necessary to treat or prevent imminent  or life-threatening deterioration. Critical care was time spent personally by me on the following activities: development of treatment plan with patient and/or surrogate as well as nursing, discussions with consultants, evaluation of patient's response to treatment, examination of patient, obtaining history from patient or surrogate, ordering and performing treatments and interventions, ordering and review of laboratory studies, ordering and review of radiographic studies, pulse oximetry and re-evaluation of patient's condition.    Rec close outpatient cardiology f/u.  Return precautions provided.       Final Clinical Impressions(s) / ED Diagnoses   Final diagnoses:  None    ED Discharge Orders    None           Lajean Saver, MD 03/05/19 1036

## 2019-02-22 NOTE — ED Notes (Signed)
Patient verbalizes understanding of discharge instructions. Opportunity for questioning and answers were provided. Armband removed by staff, pt discharged from ED via wheelchair to home.  

## 2019-12-11 ENCOUNTER — Emergency Department (HOSPITAL_COMMUNITY)
Admission: EM | Admit: 2019-12-11 | Discharge: 2019-12-11 | Discharge status: Against Medical Advice | Payer: Medicare Other

## 2020-04-28 DIAGNOSIS — R55 Syncope and collapse: Secondary | ICD-10-CM | POA: Diagnosis not present

## 2020-04-28 DIAGNOSIS — Z950 Presence of cardiac pacemaker: Secondary | ICD-10-CM | POA: Diagnosis not present

## 2020-04-28 DIAGNOSIS — Z45018 Encounter for adjustment and management of other part of cardiac pacemaker: Secondary | ICD-10-CM | POA: Diagnosis not present

## 2020-06-02 DIAGNOSIS — H00015 Hordeolum externum left lower eyelid: Secondary | ICD-10-CM | POA: Diagnosis not present

## 2020-06-25 DIAGNOSIS — B372 Candidiasis of skin and nail: Secondary | ICD-10-CM | POA: Diagnosis not present

## 2020-07-10 DIAGNOSIS — M5136 Other intervertebral disc degeneration, lumbar region: Secondary | ICD-10-CM | POA: Diagnosis not present

## 2020-07-10 DIAGNOSIS — D6869 Other thrombophilia: Secondary | ICD-10-CM | POA: Diagnosis not present

## 2020-07-10 DIAGNOSIS — I10 Essential (primary) hypertension: Secondary | ICD-10-CM | POA: Diagnosis not present

## 2020-07-10 DIAGNOSIS — I480 Paroxysmal atrial fibrillation: Secondary | ICD-10-CM | POA: Diagnosis not present

## 2020-07-10 DIAGNOSIS — E039 Hypothyroidism, unspecified: Secondary | ICD-10-CM | POA: Diagnosis not present

## 2020-07-10 DIAGNOSIS — R413 Other amnesia: Secondary | ICD-10-CM | POA: Diagnosis not present

## 2020-08-04 DIAGNOSIS — R55 Syncope and collapse: Secondary | ICD-10-CM | POA: Diagnosis not present

## 2020-08-04 DIAGNOSIS — Z45018 Encounter for adjustment and management of other part of cardiac pacemaker: Secondary | ICD-10-CM | POA: Diagnosis not present

## 2020-08-06 DIAGNOSIS — I484 Atypical atrial flutter: Secondary | ICD-10-CM | POA: Diagnosis not present

## 2020-08-06 DIAGNOSIS — I4821 Permanent atrial fibrillation: Secondary | ICD-10-CM | POA: Diagnosis not present

## 2020-08-06 DIAGNOSIS — Z9889 Other specified postprocedural states: Secondary | ICD-10-CM | POA: Diagnosis not present

## 2020-08-06 DIAGNOSIS — Z45018 Encounter for adjustment and management of other part of cardiac pacemaker: Secondary | ICD-10-CM | POA: Diagnosis not present

## 2020-08-06 DIAGNOSIS — Z888 Allergy status to other drugs, medicaments and biological substances status: Secondary | ICD-10-CM | POA: Diagnosis not present

## 2020-08-06 DIAGNOSIS — Z7901 Long term (current) use of anticoagulants: Secondary | ICD-10-CM | POA: Diagnosis not present

## 2020-08-06 DIAGNOSIS — Z881 Allergy status to other antibiotic agents status: Secondary | ICD-10-CM | POA: Diagnosis not present

## 2020-08-06 DIAGNOSIS — R55 Syncope and collapse: Secondary | ICD-10-CM | POA: Diagnosis not present

## 2020-08-10 DIAGNOSIS — I4821 Permanent atrial fibrillation: Secondary | ICD-10-CM | POA: Diagnosis not present

## 2020-08-10 DIAGNOSIS — I499 Cardiac arrhythmia, unspecified: Secondary | ICD-10-CM | POA: Diagnosis not present

## 2020-08-10 DIAGNOSIS — Z950 Presence of cardiac pacemaker: Secondary | ICD-10-CM | POA: Diagnosis not present

## 2020-09-05 DIAGNOSIS — R410 Disorientation, unspecified: Secondary | ICD-10-CM | POA: Diagnosis not present

## 2020-09-17 DIAGNOSIS — I480 Paroxysmal atrial fibrillation: Secondary | ICD-10-CM | POA: Diagnosis not present

## 2020-09-17 DIAGNOSIS — H6123 Impacted cerumen, bilateral: Secondary | ICD-10-CM | POA: Diagnosis not present

## 2020-09-17 DIAGNOSIS — D6869 Other thrombophilia: Secondary | ICD-10-CM | POA: Diagnosis not present

## 2020-09-17 DIAGNOSIS — E039 Hypothyroidism, unspecified: Secondary | ICD-10-CM | POA: Diagnosis not present

## 2020-09-17 DIAGNOSIS — M5136 Other intervertebral disc degeneration, lumbar region: Secondary | ICD-10-CM | POA: Diagnosis not present

## 2020-09-17 DIAGNOSIS — E559 Vitamin D deficiency, unspecified: Secondary | ICD-10-CM | POA: Diagnosis not present

## 2020-09-17 DIAGNOSIS — Z0000 Encounter for general adult medical examination without abnormal findings: Secondary | ICD-10-CM | POA: Diagnosis not present

## 2020-09-17 DIAGNOSIS — I10 Essential (primary) hypertension: Secondary | ICD-10-CM | POA: Diagnosis not present

## 2020-09-20 DIAGNOSIS — Z9181 History of falling: Secondary | ICD-10-CM | POA: Diagnosis not present

## 2020-09-20 DIAGNOSIS — M1990 Unspecified osteoarthritis, unspecified site: Secondary | ICD-10-CM | POA: Diagnosis not present

## 2020-09-20 DIAGNOSIS — Z8673 Personal history of transient ischemic attack (TIA), and cerebral infarction without residual deficits: Secondary | ICD-10-CM | POA: Diagnosis not present

## 2020-09-20 DIAGNOSIS — N3281 Overactive bladder: Secondary | ICD-10-CM | POA: Diagnosis not present

## 2020-09-20 DIAGNOSIS — Z7901 Long term (current) use of anticoagulants: Secondary | ICD-10-CM | POA: Diagnosis not present

## 2020-09-20 DIAGNOSIS — M810 Age-related osteoporosis without current pathological fracture: Secondary | ICD-10-CM | POA: Diagnosis not present

## 2020-09-20 DIAGNOSIS — I480 Paroxysmal atrial fibrillation: Secondary | ICD-10-CM | POA: Diagnosis not present

## 2020-09-20 DIAGNOSIS — G8929 Other chronic pain: Secondary | ICD-10-CM | POA: Diagnosis not present

## 2020-09-20 DIAGNOSIS — H9190 Unspecified hearing loss, unspecified ear: Secondary | ICD-10-CM | POA: Diagnosis not present

## 2020-09-20 DIAGNOSIS — Z950 Presence of cardiac pacemaker: Secondary | ICD-10-CM | POA: Diagnosis not present

## 2020-09-20 DIAGNOSIS — I10 Essential (primary) hypertension: Secondary | ICD-10-CM | POA: Diagnosis not present

## 2020-09-20 DIAGNOSIS — E538 Deficiency of other specified B group vitamins: Secondary | ICD-10-CM | POA: Diagnosis not present

## 2020-09-20 DIAGNOSIS — K219 Gastro-esophageal reflux disease without esophagitis: Secondary | ICD-10-CM | POA: Diagnosis not present

## 2020-09-20 DIAGNOSIS — D6869 Other thrombophilia: Secondary | ICD-10-CM | POA: Diagnosis not present

## 2020-09-20 DIAGNOSIS — Z87440 Personal history of urinary (tract) infections: Secondary | ICD-10-CM | POA: Diagnosis not present

## 2020-09-20 DIAGNOSIS — H6123 Impacted cerumen, bilateral: Secondary | ICD-10-CM | POA: Diagnosis not present

## 2020-09-20 DIAGNOSIS — Z87891 Personal history of nicotine dependence: Secondary | ICD-10-CM | POA: Diagnosis not present

## 2020-09-20 DIAGNOSIS — E039 Hypothyroidism, unspecified: Secondary | ICD-10-CM | POA: Diagnosis not present

## 2020-09-20 DIAGNOSIS — M47816 Spondylosis without myelopathy or radiculopathy, lumbar region: Secondary | ICD-10-CM | POA: Diagnosis not present

## 2020-09-20 DIAGNOSIS — M5136 Other intervertebral disc degeneration, lumbar region: Secondary | ICD-10-CM | POA: Diagnosis not present

## 2020-09-23 DIAGNOSIS — H9190 Unspecified hearing loss, unspecified ear: Secondary | ICD-10-CM | POA: Diagnosis not present

## 2020-09-23 DIAGNOSIS — Z9181 History of falling: Secondary | ICD-10-CM | POA: Diagnosis not present

## 2020-09-23 DIAGNOSIS — D6869 Other thrombophilia: Secondary | ICD-10-CM | POA: Diagnosis not present

## 2020-09-23 DIAGNOSIS — Z87891 Personal history of nicotine dependence: Secondary | ICD-10-CM | POA: Diagnosis not present

## 2020-09-23 DIAGNOSIS — Z8673 Personal history of transient ischemic attack (TIA), and cerebral infarction without residual deficits: Secondary | ICD-10-CM | POA: Diagnosis not present

## 2020-09-23 DIAGNOSIS — Z87440 Personal history of urinary (tract) infections: Secondary | ICD-10-CM | POA: Diagnosis not present

## 2020-09-23 DIAGNOSIS — I480 Paroxysmal atrial fibrillation: Secondary | ICD-10-CM | POA: Diagnosis not present

## 2020-09-23 DIAGNOSIS — E039 Hypothyroidism, unspecified: Secondary | ICD-10-CM | POA: Diagnosis not present

## 2020-09-23 DIAGNOSIS — M810 Age-related osteoporosis without current pathological fracture: Secondary | ICD-10-CM | POA: Diagnosis not present

## 2020-09-23 DIAGNOSIS — M5136 Other intervertebral disc degeneration, lumbar region: Secondary | ICD-10-CM | POA: Diagnosis not present

## 2020-09-23 DIAGNOSIS — M1990 Unspecified osteoarthritis, unspecified site: Secondary | ICD-10-CM | POA: Diagnosis not present

## 2020-09-23 DIAGNOSIS — Z950 Presence of cardiac pacemaker: Secondary | ICD-10-CM | POA: Diagnosis not present

## 2020-09-23 DIAGNOSIS — I10 Essential (primary) hypertension: Secondary | ICD-10-CM | POA: Diagnosis not present

## 2020-09-23 DIAGNOSIS — K219 Gastro-esophageal reflux disease without esophagitis: Secondary | ICD-10-CM | POA: Diagnosis not present

## 2020-09-23 DIAGNOSIS — E538 Deficiency of other specified B group vitamins: Secondary | ICD-10-CM | POA: Diagnosis not present

## 2020-09-23 DIAGNOSIS — N3281 Overactive bladder: Secondary | ICD-10-CM | POA: Diagnosis not present

## 2020-09-23 DIAGNOSIS — Z7901 Long term (current) use of anticoagulants: Secondary | ICD-10-CM | POA: Diagnosis not present

## 2020-09-23 DIAGNOSIS — M47816 Spondylosis without myelopathy or radiculopathy, lumbar region: Secondary | ICD-10-CM | POA: Diagnosis not present

## 2020-09-23 DIAGNOSIS — H6123 Impacted cerumen, bilateral: Secondary | ICD-10-CM | POA: Diagnosis not present

## 2020-09-23 DIAGNOSIS — G8929 Other chronic pain: Secondary | ICD-10-CM | POA: Diagnosis not present

## 2020-09-25 DIAGNOSIS — M5136 Other intervertebral disc degeneration, lumbar region: Secondary | ICD-10-CM | POA: Diagnosis not present

## 2020-09-25 DIAGNOSIS — I10 Essential (primary) hypertension: Secondary | ICD-10-CM | POA: Diagnosis not present

## 2020-09-25 DIAGNOSIS — E039 Hypothyroidism, unspecified: Secondary | ICD-10-CM | POA: Diagnosis not present

## 2020-09-25 DIAGNOSIS — H9190 Unspecified hearing loss, unspecified ear: Secondary | ICD-10-CM | POA: Diagnosis not present

## 2020-09-25 DIAGNOSIS — M47816 Spondylosis without myelopathy or radiculopathy, lumbar region: Secondary | ICD-10-CM | POA: Diagnosis not present

## 2020-09-25 DIAGNOSIS — K219 Gastro-esophageal reflux disease without esophagitis: Secondary | ICD-10-CM | POA: Diagnosis not present

## 2020-09-25 DIAGNOSIS — Z87891 Personal history of nicotine dependence: Secondary | ICD-10-CM | POA: Diagnosis not present

## 2020-09-25 DIAGNOSIS — Z9181 History of falling: Secondary | ICD-10-CM | POA: Diagnosis not present

## 2020-09-25 DIAGNOSIS — H6123 Impacted cerumen, bilateral: Secondary | ICD-10-CM | POA: Diagnosis not present

## 2020-09-25 DIAGNOSIS — Z7901 Long term (current) use of anticoagulants: Secondary | ICD-10-CM | POA: Diagnosis not present

## 2020-09-25 DIAGNOSIS — E538 Deficiency of other specified B group vitamins: Secondary | ICD-10-CM | POA: Diagnosis not present

## 2020-09-25 DIAGNOSIS — Z950 Presence of cardiac pacemaker: Secondary | ICD-10-CM | POA: Diagnosis not present

## 2020-09-25 DIAGNOSIS — Z8673 Personal history of transient ischemic attack (TIA), and cerebral infarction without residual deficits: Secondary | ICD-10-CM | POA: Diagnosis not present

## 2020-09-25 DIAGNOSIS — M810 Age-related osteoporosis without current pathological fracture: Secondary | ICD-10-CM | POA: Diagnosis not present

## 2020-09-25 DIAGNOSIS — Z87440 Personal history of urinary (tract) infections: Secondary | ICD-10-CM | POA: Diagnosis not present

## 2020-09-25 DIAGNOSIS — M1990 Unspecified osteoarthritis, unspecified site: Secondary | ICD-10-CM | POA: Diagnosis not present

## 2020-09-25 DIAGNOSIS — I480 Paroxysmal atrial fibrillation: Secondary | ICD-10-CM | POA: Diagnosis not present

## 2020-09-25 DIAGNOSIS — D6869 Other thrombophilia: Secondary | ICD-10-CM | POA: Diagnosis not present

## 2020-09-25 DIAGNOSIS — N3281 Overactive bladder: Secondary | ICD-10-CM | POA: Diagnosis not present

## 2020-09-25 DIAGNOSIS — G8929 Other chronic pain: Secondary | ICD-10-CM | POA: Diagnosis not present

## 2020-09-29 DIAGNOSIS — E039 Hypothyroidism, unspecified: Secondary | ICD-10-CM | POA: Diagnosis not present

## 2020-09-29 DIAGNOSIS — M1990 Unspecified osteoarthritis, unspecified site: Secondary | ICD-10-CM | POA: Diagnosis not present

## 2020-09-29 DIAGNOSIS — M5136 Other intervertebral disc degeneration, lumbar region: Secondary | ICD-10-CM | POA: Diagnosis not present

## 2020-09-29 DIAGNOSIS — G8929 Other chronic pain: Secondary | ICD-10-CM | POA: Diagnosis not present

## 2020-09-29 DIAGNOSIS — M47816 Spondylosis without myelopathy or radiculopathy, lumbar region: Secondary | ICD-10-CM | POA: Diagnosis not present

## 2020-09-29 DIAGNOSIS — I480 Paroxysmal atrial fibrillation: Secondary | ICD-10-CM | POA: Diagnosis not present

## 2020-09-29 DIAGNOSIS — H6123 Impacted cerumen, bilateral: Secondary | ICD-10-CM | POA: Diagnosis not present

## 2020-09-29 DIAGNOSIS — D6869 Other thrombophilia: Secondary | ICD-10-CM | POA: Diagnosis not present

## 2020-09-29 DIAGNOSIS — E538 Deficiency of other specified B group vitamins: Secondary | ICD-10-CM | POA: Diagnosis not present

## 2020-09-29 DIAGNOSIS — Z87891 Personal history of nicotine dependence: Secondary | ICD-10-CM | POA: Diagnosis not present

## 2020-09-29 DIAGNOSIS — Z87440 Personal history of urinary (tract) infections: Secondary | ICD-10-CM | POA: Diagnosis not present

## 2020-09-29 DIAGNOSIS — N3281 Overactive bladder: Secondary | ICD-10-CM | POA: Diagnosis not present

## 2020-09-29 DIAGNOSIS — I10 Essential (primary) hypertension: Secondary | ICD-10-CM | POA: Diagnosis not present

## 2020-09-29 DIAGNOSIS — Z8673 Personal history of transient ischemic attack (TIA), and cerebral infarction without residual deficits: Secondary | ICD-10-CM | POA: Diagnosis not present

## 2020-09-29 DIAGNOSIS — M810 Age-related osteoporosis without current pathological fracture: Secondary | ICD-10-CM | POA: Diagnosis not present

## 2020-09-29 DIAGNOSIS — Z9181 History of falling: Secondary | ICD-10-CM | POA: Diagnosis not present

## 2020-09-29 DIAGNOSIS — Z950 Presence of cardiac pacemaker: Secondary | ICD-10-CM | POA: Diagnosis not present

## 2020-09-29 DIAGNOSIS — H9190 Unspecified hearing loss, unspecified ear: Secondary | ICD-10-CM | POA: Diagnosis not present

## 2020-09-29 DIAGNOSIS — Z7901 Long term (current) use of anticoagulants: Secondary | ICD-10-CM | POA: Diagnosis not present

## 2020-09-29 DIAGNOSIS — K219 Gastro-esophageal reflux disease without esophagitis: Secondary | ICD-10-CM | POA: Diagnosis not present

## 2020-10-02 DIAGNOSIS — R300 Dysuria: Secondary | ICD-10-CM | POA: Diagnosis not present

## 2020-10-08 DIAGNOSIS — I480 Paroxysmal atrial fibrillation: Secondary | ICD-10-CM | POA: Diagnosis not present

## 2020-10-08 DIAGNOSIS — K219 Gastro-esophageal reflux disease without esophagitis: Secondary | ICD-10-CM | POA: Diagnosis not present

## 2020-10-08 DIAGNOSIS — I10 Essential (primary) hypertension: Secondary | ICD-10-CM | POA: Diagnosis not present

## 2020-10-08 DIAGNOSIS — M5136 Other intervertebral disc degeneration, lumbar region: Secondary | ICD-10-CM | POA: Diagnosis not present

## 2020-10-08 DIAGNOSIS — Z950 Presence of cardiac pacemaker: Secondary | ICD-10-CM | POA: Diagnosis not present

## 2020-10-08 DIAGNOSIS — Z8673 Personal history of transient ischemic attack (TIA), and cerebral infarction without residual deficits: Secondary | ICD-10-CM | POA: Diagnosis not present

## 2020-10-08 DIAGNOSIS — D6869 Other thrombophilia: Secondary | ICD-10-CM | POA: Diagnosis not present

## 2020-10-08 DIAGNOSIS — Z9181 History of falling: Secondary | ICD-10-CM | POA: Diagnosis not present

## 2020-10-08 DIAGNOSIS — N3281 Overactive bladder: Secondary | ICD-10-CM | POA: Diagnosis not present

## 2020-10-08 DIAGNOSIS — G8929 Other chronic pain: Secondary | ICD-10-CM | POA: Diagnosis not present

## 2020-10-08 DIAGNOSIS — H9190 Unspecified hearing loss, unspecified ear: Secondary | ICD-10-CM | POA: Diagnosis not present

## 2020-10-08 DIAGNOSIS — Z87891 Personal history of nicotine dependence: Secondary | ICD-10-CM | POA: Diagnosis not present

## 2020-10-08 DIAGNOSIS — H6123 Impacted cerumen, bilateral: Secondary | ICD-10-CM | POA: Diagnosis not present

## 2020-10-08 DIAGNOSIS — E538 Deficiency of other specified B group vitamins: Secondary | ICD-10-CM | POA: Diagnosis not present

## 2020-10-08 DIAGNOSIS — Z7901 Long term (current) use of anticoagulants: Secondary | ICD-10-CM | POA: Diagnosis not present

## 2020-10-08 DIAGNOSIS — M1990 Unspecified osteoarthritis, unspecified site: Secondary | ICD-10-CM | POA: Diagnosis not present

## 2020-10-08 DIAGNOSIS — M47816 Spondylosis without myelopathy or radiculopathy, lumbar region: Secondary | ICD-10-CM | POA: Diagnosis not present

## 2020-10-08 DIAGNOSIS — E039 Hypothyroidism, unspecified: Secondary | ICD-10-CM | POA: Diagnosis not present

## 2020-10-08 DIAGNOSIS — Z87440 Personal history of urinary (tract) infections: Secondary | ICD-10-CM | POA: Diagnosis not present

## 2020-10-08 DIAGNOSIS — M810 Age-related osteoporosis without current pathological fracture: Secondary | ICD-10-CM | POA: Diagnosis not present

## 2020-10-16 DIAGNOSIS — I10 Essential (primary) hypertension: Secondary | ICD-10-CM | POA: Diagnosis not present

## 2020-10-16 DIAGNOSIS — H02886 Meibomian gland dysfunction of left eye, unspecified eyelid: Secondary | ICD-10-CM | POA: Diagnosis not present

## 2020-10-16 DIAGNOSIS — N3281 Overactive bladder: Secondary | ICD-10-CM | POA: Diagnosis not present

## 2020-11-10 ENCOUNTER — Institutional Professional Consult (permissible substitution): Payer: Medicare Other | Admitting: Neurology

## 2020-11-17 DIAGNOSIS — Z9181 History of falling: Secondary | ICD-10-CM | POA: Diagnosis not present

## 2020-11-17 DIAGNOSIS — H6123 Impacted cerumen, bilateral: Secondary | ICD-10-CM | POA: Diagnosis not present

## 2020-11-17 DIAGNOSIS — Z45018 Encounter for adjustment and management of other part of cardiac pacemaker: Secondary | ICD-10-CM | POA: Diagnosis not present

## 2020-11-17 DIAGNOSIS — Z87891 Personal history of nicotine dependence: Secondary | ICD-10-CM | POA: Diagnosis not present

## 2020-11-17 DIAGNOSIS — E538 Deficiency of other specified B group vitamins: Secondary | ICD-10-CM | POA: Diagnosis not present

## 2020-11-17 DIAGNOSIS — K219 Gastro-esophageal reflux disease without esophagitis: Secondary | ICD-10-CM | POA: Diagnosis not present

## 2020-11-17 DIAGNOSIS — I4821 Permanent atrial fibrillation: Secondary | ICD-10-CM | POA: Diagnosis not present

## 2020-11-17 DIAGNOSIS — G8929 Other chronic pain: Secondary | ICD-10-CM | POA: Diagnosis not present

## 2020-11-17 DIAGNOSIS — N3281 Overactive bladder: Secondary | ICD-10-CM | POA: Diagnosis not present

## 2020-11-17 DIAGNOSIS — E039 Hypothyroidism, unspecified: Secondary | ICD-10-CM | POA: Diagnosis not present

## 2020-11-17 DIAGNOSIS — Z950 Presence of cardiac pacemaker: Secondary | ICD-10-CM | POA: Diagnosis not present

## 2020-11-17 DIAGNOSIS — H9190 Unspecified hearing loss, unspecified ear: Secondary | ICD-10-CM | POA: Diagnosis not present

## 2020-11-17 DIAGNOSIS — Z87440 Personal history of urinary (tract) infections: Secondary | ICD-10-CM | POA: Diagnosis not present

## 2020-11-17 DIAGNOSIS — R55 Syncope and collapse: Secondary | ICD-10-CM | POA: Diagnosis not present

## 2020-11-17 DIAGNOSIS — M5136 Other intervertebral disc degeneration, lumbar region: Secondary | ICD-10-CM | POA: Diagnosis not present

## 2020-11-17 DIAGNOSIS — M810 Age-related osteoporosis without current pathological fracture: Secondary | ICD-10-CM | POA: Diagnosis not present

## 2020-11-17 DIAGNOSIS — M1990 Unspecified osteoarthritis, unspecified site: Secondary | ICD-10-CM | POA: Diagnosis not present

## 2020-11-17 DIAGNOSIS — M47816 Spondylosis without myelopathy or radiculopathy, lumbar region: Secondary | ICD-10-CM | POA: Diagnosis not present

## 2020-11-17 DIAGNOSIS — I10 Essential (primary) hypertension: Secondary | ICD-10-CM | POA: Diagnosis not present

## 2020-11-17 DIAGNOSIS — Z8673 Personal history of transient ischemic attack (TIA), and cerebral infarction without residual deficits: Secondary | ICD-10-CM | POA: Diagnosis not present

## 2020-11-17 DIAGNOSIS — I480 Paroxysmal atrial fibrillation: Secondary | ICD-10-CM | POA: Diagnosis not present

## 2020-11-17 DIAGNOSIS — D6869 Other thrombophilia: Secondary | ICD-10-CM | POA: Diagnosis not present

## 2020-11-17 DIAGNOSIS — Z7901 Long term (current) use of anticoagulants: Secondary | ICD-10-CM | POA: Diagnosis not present

## 2020-11-19 DIAGNOSIS — I480 Paroxysmal atrial fibrillation: Secondary | ICD-10-CM | POA: Diagnosis not present

## 2020-11-19 DIAGNOSIS — H9190 Unspecified hearing loss, unspecified ear: Secondary | ICD-10-CM | POA: Diagnosis not present

## 2020-11-19 DIAGNOSIS — M47816 Spondylosis without myelopathy or radiculopathy, lumbar region: Secondary | ICD-10-CM | POA: Diagnosis not present

## 2020-11-19 DIAGNOSIS — M5136 Other intervertebral disc degeneration, lumbar region: Secondary | ICD-10-CM | POA: Diagnosis not present

## 2020-11-19 DIAGNOSIS — D6869 Other thrombophilia: Secondary | ICD-10-CM | POA: Diagnosis not present

## 2020-11-19 DIAGNOSIS — N3281 Overactive bladder: Secondary | ICD-10-CM | POA: Diagnosis not present

## 2020-11-19 DIAGNOSIS — E039 Hypothyroidism, unspecified: Secondary | ICD-10-CM | POA: Diagnosis not present

## 2020-11-19 DIAGNOSIS — G8929 Other chronic pain: Secondary | ICD-10-CM | POA: Diagnosis not present

## 2020-11-19 DIAGNOSIS — I10 Essential (primary) hypertension: Secondary | ICD-10-CM | POA: Diagnosis not present

## 2020-11-19 DIAGNOSIS — M810 Age-related osteoporosis without current pathological fracture: Secondary | ICD-10-CM | POA: Diagnosis not present

## 2020-11-24 DIAGNOSIS — Z8673 Personal history of transient ischemic attack (TIA), and cerebral infarction without residual deficits: Secondary | ICD-10-CM | POA: Diagnosis not present

## 2020-11-24 DIAGNOSIS — M1990 Unspecified osteoarthritis, unspecified site: Secondary | ICD-10-CM | POA: Diagnosis not present

## 2020-11-24 DIAGNOSIS — Z9181 History of falling: Secondary | ICD-10-CM | POA: Diagnosis not present

## 2020-11-24 DIAGNOSIS — E538 Deficiency of other specified B group vitamins: Secondary | ICD-10-CM | POA: Diagnosis not present

## 2020-11-24 DIAGNOSIS — H6123 Impacted cerumen, bilateral: Secondary | ICD-10-CM | POA: Diagnosis not present

## 2020-11-24 DIAGNOSIS — I480 Paroxysmal atrial fibrillation: Secondary | ICD-10-CM | POA: Diagnosis not present

## 2020-11-24 DIAGNOSIS — Z7901 Long term (current) use of anticoagulants: Secondary | ICD-10-CM | POA: Diagnosis not present

## 2020-11-24 DIAGNOSIS — E039 Hypothyroidism, unspecified: Secondary | ICD-10-CM | POA: Diagnosis not present

## 2020-11-24 DIAGNOSIS — D6869 Other thrombophilia: Secondary | ICD-10-CM | POA: Diagnosis not present

## 2020-11-24 DIAGNOSIS — M810 Age-related osteoporosis without current pathological fracture: Secondary | ICD-10-CM | POA: Diagnosis not present

## 2020-11-24 DIAGNOSIS — I10 Essential (primary) hypertension: Secondary | ICD-10-CM | POA: Diagnosis not present

## 2020-11-24 DIAGNOSIS — M5136 Other intervertebral disc degeneration, lumbar region: Secondary | ICD-10-CM | POA: Diagnosis not present

## 2020-11-24 DIAGNOSIS — Z87440 Personal history of urinary (tract) infections: Secondary | ICD-10-CM | POA: Diagnosis not present

## 2020-11-24 DIAGNOSIS — N3281 Overactive bladder: Secondary | ICD-10-CM | POA: Diagnosis not present

## 2020-11-24 DIAGNOSIS — M47816 Spondylosis without myelopathy or radiculopathy, lumbar region: Secondary | ICD-10-CM | POA: Diagnosis not present

## 2020-11-24 DIAGNOSIS — K219 Gastro-esophageal reflux disease without esophagitis: Secondary | ICD-10-CM | POA: Diagnosis not present

## 2020-11-24 DIAGNOSIS — Z87891 Personal history of nicotine dependence: Secondary | ICD-10-CM | POA: Diagnosis not present

## 2020-11-24 DIAGNOSIS — G8929 Other chronic pain: Secondary | ICD-10-CM | POA: Diagnosis not present

## 2020-11-24 DIAGNOSIS — H9190 Unspecified hearing loss, unspecified ear: Secondary | ICD-10-CM | POA: Diagnosis not present

## 2020-11-27 DIAGNOSIS — E039 Hypothyroidism, unspecified: Secondary | ICD-10-CM | POA: Diagnosis not present

## 2020-11-27 DIAGNOSIS — M810 Age-related osteoporosis without current pathological fracture: Secondary | ICD-10-CM | POA: Diagnosis not present

## 2020-11-27 DIAGNOSIS — H9190 Unspecified hearing loss, unspecified ear: Secondary | ICD-10-CM | POA: Diagnosis not present

## 2020-11-27 DIAGNOSIS — Z7901 Long term (current) use of anticoagulants: Secondary | ICD-10-CM | POA: Diagnosis not present

## 2020-11-27 DIAGNOSIS — Z9181 History of falling: Secondary | ICD-10-CM | POA: Diagnosis not present

## 2020-11-27 DIAGNOSIS — M5136 Other intervertebral disc degeneration, lumbar region: Secondary | ICD-10-CM | POA: Diagnosis not present

## 2020-11-27 DIAGNOSIS — M47816 Spondylosis without myelopathy or radiculopathy, lumbar region: Secondary | ICD-10-CM | POA: Diagnosis not present

## 2020-11-27 DIAGNOSIS — K219 Gastro-esophageal reflux disease without esophagitis: Secondary | ICD-10-CM | POA: Diagnosis not present

## 2020-11-27 DIAGNOSIS — E538 Deficiency of other specified B group vitamins: Secondary | ICD-10-CM | POA: Diagnosis not present

## 2020-11-27 DIAGNOSIS — I480 Paroxysmal atrial fibrillation: Secondary | ICD-10-CM | POA: Diagnosis not present

## 2020-11-27 DIAGNOSIS — D6869 Other thrombophilia: Secondary | ICD-10-CM | POA: Diagnosis not present

## 2020-11-27 DIAGNOSIS — M1990 Unspecified osteoarthritis, unspecified site: Secondary | ICD-10-CM | POA: Diagnosis not present

## 2020-11-27 DIAGNOSIS — Z8673 Personal history of transient ischemic attack (TIA), and cerebral infarction without residual deficits: Secondary | ICD-10-CM | POA: Diagnosis not present

## 2020-11-27 DIAGNOSIS — Z87891 Personal history of nicotine dependence: Secondary | ICD-10-CM | POA: Diagnosis not present

## 2020-11-27 DIAGNOSIS — Z87440 Personal history of urinary (tract) infections: Secondary | ICD-10-CM | POA: Diagnosis not present

## 2020-11-27 DIAGNOSIS — H6123 Impacted cerumen, bilateral: Secondary | ICD-10-CM | POA: Diagnosis not present

## 2020-11-27 DIAGNOSIS — G8929 Other chronic pain: Secondary | ICD-10-CM | POA: Diagnosis not present

## 2020-11-27 DIAGNOSIS — N3281 Overactive bladder: Secondary | ICD-10-CM | POA: Diagnosis not present

## 2020-11-27 DIAGNOSIS — I10 Essential (primary) hypertension: Secondary | ICD-10-CM | POA: Diagnosis not present

## 2020-12-01 DIAGNOSIS — I10 Essential (primary) hypertension: Secondary | ICD-10-CM | POA: Diagnosis not present

## 2020-12-01 DIAGNOSIS — Z87891 Personal history of nicotine dependence: Secondary | ICD-10-CM | POA: Diagnosis not present

## 2020-12-01 DIAGNOSIS — Z7901 Long term (current) use of anticoagulants: Secondary | ICD-10-CM | POA: Diagnosis not present

## 2020-12-01 DIAGNOSIS — M47816 Spondylosis without myelopathy or radiculopathy, lumbar region: Secondary | ICD-10-CM | POA: Diagnosis not present

## 2020-12-01 DIAGNOSIS — Z9181 History of falling: Secondary | ICD-10-CM | POA: Diagnosis not present

## 2020-12-01 DIAGNOSIS — Z87440 Personal history of urinary (tract) infections: Secondary | ICD-10-CM | POA: Diagnosis not present

## 2020-12-01 DIAGNOSIS — M810 Age-related osteoporosis without current pathological fracture: Secondary | ICD-10-CM | POA: Diagnosis not present

## 2020-12-01 DIAGNOSIS — H9190 Unspecified hearing loss, unspecified ear: Secondary | ICD-10-CM | POA: Diagnosis not present

## 2020-12-01 DIAGNOSIS — N3281 Overactive bladder: Secondary | ICD-10-CM | POA: Diagnosis not present

## 2020-12-01 DIAGNOSIS — D6869 Other thrombophilia: Secondary | ICD-10-CM | POA: Diagnosis not present

## 2020-12-01 DIAGNOSIS — M1990 Unspecified osteoarthritis, unspecified site: Secondary | ICD-10-CM | POA: Diagnosis not present

## 2020-12-01 DIAGNOSIS — E039 Hypothyroidism, unspecified: Secondary | ICD-10-CM | POA: Diagnosis not present

## 2020-12-01 DIAGNOSIS — Z8673 Personal history of transient ischemic attack (TIA), and cerebral infarction without residual deficits: Secondary | ICD-10-CM | POA: Diagnosis not present

## 2020-12-01 DIAGNOSIS — I480 Paroxysmal atrial fibrillation: Secondary | ICD-10-CM | POA: Diagnosis not present

## 2020-12-01 DIAGNOSIS — M5136 Other intervertebral disc degeneration, lumbar region: Secondary | ICD-10-CM | POA: Diagnosis not present

## 2020-12-01 DIAGNOSIS — H6123 Impacted cerumen, bilateral: Secondary | ICD-10-CM | POA: Diagnosis not present

## 2020-12-01 DIAGNOSIS — E538 Deficiency of other specified B group vitamins: Secondary | ICD-10-CM | POA: Diagnosis not present

## 2020-12-01 DIAGNOSIS — K219 Gastro-esophageal reflux disease without esophagitis: Secondary | ICD-10-CM | POA: Diagnosis not present

## 2020-12-01 DIAGNOSIS — G8929 Other chronic pain: Secondary | ICD-10-CM | POA: Diagnosis not present

## 2020-12-03 DIAGNOSIS — H6123 Impacted cerumen, bilateral: Secondary | ICD-10-CM | POA: Diagnosis not present

## 2020-12-03 DIAGNOSIS — H9190 Unspecified hearing loss, unspecified ear: Secondary | ICD-10-CM | POA: Diagnosis not present

## 2020-12-03 DIAGNOSIS — N3281 Overactive bladder: Secondary | ICD-10-CM | POA: Diagnosis not present

## 2020-12-03 DIAGNOSIS — G8929 Other chronic pain: Secondary | ICD-10-CM | POA: Diagnosis not present

## 2020-12-03 DIAGNOSIS — Z87440 Personal history of urinary (tract) infections: Secondary | ICD-10-CM | POA: Diagnosis not present

## 2020-12-03 DIAGNOSIS — E538 Deficiency of other specified B group vitamins: Secondary | ICD-10-CM | POA: Diagnosis not present

## 2020-12-03 DIAGNOSIS — Z9181 History of falling: Secondary | ICD-10-CM | POA: Diagnosis not present

## 2020-12-03 DIAGNOSIS — D6869 Other thrombophilia: Secondary | ICD-10-CM | POA: Diagnosis not present

## 2020-12-03 DIAGNOSIS — I10 Essential (primary) hypertension: Secondary | ICD-10-CM | POA: Diagnosis not present

## 2020-12-03 DIAGNOSIS — K219 Gastro-esophageal reflux disease without esophagitis: Secondary | ICD-10-CM | POA: Diagnosis not present

## 2020-12-03 DIAGNOSIS — M1990 Unspecified osteoarthritis, unspecified site: Secondary | ICD-10-CM | POA: Diagnosis not present

## 2020-12-03 DIAGNOSIS — M810 Age-related osteoporosis without current pathological fracture: Secondary | ICD-10-CM | POA: Diagnosis not present

## 2020-12-03 DIAGNOSIS — Z87891 Personal history of nicotine dependence: Secondary | ICD-10-CM | POA: Diagnosis not present

## 2020-12-03 DIAGNOSIS — I480 Paroxysmal atrial fibrillation: Secondary | ICD-10-CM | POA: Diagnosis not present

## 2020-12-03 DIAGNOSIS — M47816 Spondylosis without myelopathy or radiculopathy, lumbar region: Secondary | ICD-10-CM | POA: Diagnosis not present

## 2020-12-03 DIAGNOSIS — Z7901 Long term (current) use of anticoagulants: Secondary | ICD-10-CM | POA: Diagnosis not present

## 2020-12-03 DIAGNOSIS — E039 Hypothyroidism, unspecified: Secondary | ICD-10-CM | POA: Diagnosis not present

## 2020-12-03 DIAGNOSIS — M5136 Other intervertebral disc degeneration, lumbar region: Secondary | ICD-10-CM | POA: Diagnosis not present

## 2020-12-03 DIAGNOSIS — Z8673 Personal history of transient ischemic attack (TIA), and cerebral infarction without residual deficits: Secondary | ICD-10-CM | POA: Diagnosis not present

## 2020-12-10 DIAGNOSIS — Z8673 Personal history of transient ischemic attack (TIA), and cerebral infarction without residual deficits: Secondary | ICD-10-CM | POA: Diagnosis not present

## 2020-12-10 DIAGNOSIS — Z9181 History of falling: Secondary | ICD-10-CM | POA: Diagnosis not present

## 2020-12-10 DIAGNOSIS — D6869 Other thrombophilia: Secondary | ICD-10-CM | POA: Diagnosis not present

## 2020-12-10 DIAGNOSIS — N3281 Overactive bladder: Secondary | ICD-10-CM | POA: Diagnosis not present

## 2020-12-10 DIAGNOSIS — K219 Gastro-esophageal reflux disease without esophagitis: Secondary | ICD-10-CM | POA: Diagnosis not present

## 2020-12-10 DIAGNOSIS — M1990 Unspecified osteoarthritis, unspecified site: Secondary | ICD-10-CM | POA: Diagnosis not present

## 2020-12-10 DIAGNOSIS — G8929 Other chronic pain: Secondary | ICD-10-CM | POA: Diagnosis not present

## 2020-12-10 DIAGNOSIS — M810 Age-related osteoporosis without current pathological fracture: Secondary | ICD-10-CM | POA: Diagnosis not present

## 2020-12-10 DIAGNOSIS — Z7901 Long term (current) use of anticoagulants: Secondary | ICD-10-CM | POA: Diagnosis not present

## 2020-12-10 DIAGNOSIS — Z87891 Personal history of nicotine dependence: Secondary | ICD-10-CM | POA: Diagnosis not present

## 2020-12-10 DIAGNOSIS — H6123 Impacted cerumen, bilateral: Secondary | ICD-10-CM | POA: Diagnosis not present

## 2020-12-10 DIAGNOSIS — Z87440 Personal history of urinary (tract) infections: Secondary | ICD-10-CM | POA: Diagnosis not present

## 2020-12-10 DIAGNOSIS — M47816 Spondylosis without myelopathy or radiculopathy, lumbar region: Secondary | ICD-10-CM | POA: Diagnosis not present

## 2020-12-10 DIAGNOSIS — E039 Hypothyroidism, unspecified: Secondary | ICD-10-CM | POA: Diagnosis not present

## 2020-12-10 DIAGNOSIS — I10 Essential (primary) hypertension: Secondary | ICD-10-CM | POA: Diagnosis not present

## 2020-12-10 DIAGNOSIS — M5136 Other intervertebral disc degeneration, lumbar region: Secondary | ICD-10-CM | POA: Diagnosis not present

## 2020-12-10 DIAGNOSIS — E538 Deficiency of other specified B group vitamins: Secondary | ICD-10-CM | POA: Diagnosis not present

## 2020-12-10 DIAGNOSIS — H9190 Unspecified hearing loss, unspecified ear: Secondary | ICD-10-CM | POA: Diagnosis not present

## 2020-12-10 DIAGNOSIS — I480 Paroxysmal atrial fibrillation: Secondary | ICD-10-CM | POA: Diagnosis not present

## 2020-12-12 DIAGNOSIS — H6123 Impacted cerumen, bilateral: Secondary | ICD-10-CM | POA: Diagnosis not present

## 2020-12-12 DIAGNOSIS — E538 Deficiency of other specified B group vitamins: Secondary | ICD-10-CM | POA: Diagnosis not present

## 2020-12-12 DIAGNOSIS — M5136 Other intervertebral disc degeneration, lumbar region: Secondary | ICD-10-CM | POA: Diagnosis not present

## 2020-12-12 DIAGNOSIS — D6869 Other thrombophilia: Secondary | ICD-10-CM | POA: Diagnosis not present

## 2020-12-12 DIAGNOSIS — E039 Hypothyroidism, unspecified: Secondary | ICD-10-CM | POA: Diagnosis not present

## 2020-12-12 DIAGNOSIS — Z7901 Long term (current) use of anticoagulants: Secondary | ICD-10-CM | POA: Diagnosis not present

## 2020-12-12 DIAGNOSIS — K219 Gastro-esophageal reflux disease without esophagitis: Secondary | ICD-10-CM | POA: Diagnosis not present

## 2020-12-12 DIAGNOSIS — Z8673 Personal history of transient ischemic attack (TIA), and cerebral infarction without residual deficits: Secondary | ICD-10-CM | POA: Diagnosis not present

## 2020-12-12 DIAGNOSIS — Z87891 Personal history of nicotine dependence: Secondary | ICD-10-CM | POA: Diagnosis not present

## 2020-12-12 DIAGNOSIS — Z9181 History of falling: Secondary | ICD-10-CM | POA: Diagnosis not present

## 2020-12-12 DIAGNOSIS — M810 Age-related osteoporosis without current pathological fracture: Secondary | ICD-10-CM | POA: Diagnosis not present

## 2020-12-12 DIAGNOSIS — Z87440 Personal history of urinary (tract) infections: Secondary | ICD-10-CM | POA: Diagnosis not present

## 2020-12-12 DIAGNOSIS — I480 Paroxysmal atrial fibrillation: Secondary | ICD-10-CM | POA: Diagnosis not present

## 2020-12-12 DIAGNOSIS — M47816 Spondylosis without myelopathy or radiculopathy, lumbar region: Secondary | ICD-10-CM | POA: Diagnosis not present

## 2020-12-12 DIAGNOSIS — G8929 Other chronic pain: Secondary | ICD-10-CM | POA: Diagnosis not present

## 2020-12-12 DIAGNOSIS — H9190 Unspecified hearing loss, unspecified ear: Secondary | ICD-10-CM | POA: Diagnosis not present

## 2020-12-12 DIAGNOSIS — N3281 Overactive bladder: Secondary | ICD-10-CM | POA: Diagnosis not present

## 2020-12-12 DIAGNOSIS — I10 Essential (primary) hypertension: Secondary | ICD-10-CM | POA: Diagnosis not present

## 2020-12-12 DIAGNOSIS — M1990 Unspecified osteoarthritis, unspecified site: Secondary | ICD-10-CM | POA: Diagnosis not present

## 2020-12-15 DIAGNOSIS — D6869 Other thrombophilia: Secondary | ICD-10-CM | POA: Diagnosis not present

## 2020-12-15 DIAGNOSIS — E039 Hypothyroidism, unspecified: Secondary | ICD-10-CM | POA: Diagnosis not present

## 2020-12-15 DIAGNOSIS — Z7901 Long term (current) use of anticoagulants: Secondary | ICD-10-CM | POA: Diagnosis not present

## 2020-12-15 DIAGNOSIS — Z9181 History of falling: Secondary | ICD-10-CM | POA: Diagnosis not present

## 2020-12-15 DIAGNOSIS — Z8673 Personal history of transient ischemic attack (TIA), and cerebral infarction without residual deficits: Secondary | ICD-10-CM | POA: Diagnosis not present

## 2020-12-15 DIAGNOSIS — M1990 Unspecified osteoarthritis, unspecified site: Secondary | ICD-10-CM | POA: Diagnosis not present

## 2020-12-15 DIAGNOSIS — E538 Deficiency of other specified B group vitamins: Secondary | ICD-10-CM | POA: Diagnosis not present

## 2020-12-15 DIAGNOSIS — K219 Gastro-esophageal reflux disease without esophagitis: Secondary | ICD-10-CM | POA: Diagnosis not present

## 2020-12-15 DIAGNOSIS — Z87440 Personal history of urinary (tract) infections: Secondary | ICD-10-CM | POA: Diagnosis not present

## 2020-12-15 DIAGNOSIS — N3281 Overactive bladder: Secondary | ICD-10-CM | POA: Diagnosis not present

## 2020-12-15 DIAGNOSIS — M810 Age-related osteoporosis without current pathological fracture: Secondary | ICD-10-CM | POA: Diagnosis not present

## 2020-12-15 DIAGNOSIS — H9190 Unspecified hearing loss, unspecified ear: Secondary | ICD-10-CM | POA: Diagnosis not present

## 2020-12-15 DIAGNOSIS — H6123 Impacted cerumen, bilateral: Secondary | ICD-10-CM | POA: Diagnosis not present

## 2020-12-15 DIAGNOSIS — G8929 Other chronic pain: Secondary | ICD-10-CM | POA: Diagnosis not present

## 2020-12-15 DIAGNOSIS — I480 Paroxysmal atrial fibrillation: Secondary | ICD-10-CM | POA: Diagnosis not present

## 2020-12-15 DIAGNOSIS — M5136 Other intervertebral disc degeneration, lumbar region: Secondary | ICD-10-CM | POA: Diagnosis not present

## 2020-12-15 DIAGNOSIS — M47816 Spondylosis without myelopathy or radiculopathy, lumbar region: Secondary | ICD-10-CM | POA: Diagnosis not present

## 2020-12-15 DIAGNOSIS — Z87891 Personal history of nicotine dependence: Secondary | ICD-10-CM | POA: Diagnosis not present

## 2020-12-15 DIAGNOSIS — I10 Essential (primary) hypertension: Secondary | ICD-10-CM | POA: Diagnosis not present

## 2020-12-16 DIAGNOSIS — I10 Essential (primary) hypertension: Secondary | ICD-10-CM | POA: Diagnosis not present

## 2020-12-16 DIAGNOSIS — N3281 Overactive bladder: Secondary | ICD-10-CM | POA: Diagnosis not present

## 2020-12-16 DIAGNOSIS — I480 Paroxysmal atrial fibrillation: Secondary | ICD-10-CM | POA: Diagnosis not present

## 2020-12-16 DIAGNOSIS — H6123 Impacted cerumen, bilateral: Secondary | ICD-10-CM | POA: Diagnosis not present

## 2020-12-16 DIAGNOSIS — G8929 Other chronic pain: Secondary | ICD-10-CM | POA: Diagnosis not present

## 2020-12-16 DIAGNOSIS — Z87891 Personal history of nicotine dependence: Secondary | ICD-10-CM | POA: Diagnosis not present

## 2020-12-16 DIAGNOSIS — M810 Age-related osteoporosis without current pathological fracture: Secondary | ICD-10-CM | POA: Diagnosis not present

## 2020-12-16 DIAGNOSIS — E039 Hypothyroidism, unspecified: Secondary | ICD-10-CM | POA: Diagnosis not present

## 2020-12-16 DIAGNOSIS — Z87440 Personal history of urinary (tract) infections: Secondary | ICD-10-CM | POA: Diagnosis not present

## 2020-12-16 DIAGNOSIS — K219 Gastro-esophageal reflux disease without esophagitis: Secondary | ICD-10-CM | POA: Diagnosis not present

## 2020-12-16 DIAGNOSIS — M5136 Other intervertebral disc degeneration, lumbar region: Secondary | ICD-10-CM | POA: Diagnosis not present

## 2020-12-16 DIAGNOSIS — D6869 Other thrombophilia: Secondary | ICD-10-CM | POA: Diagnosis not present

## 2020-12-16 DIAGNOSIS — Z9181 History of falling: Secondary | ICD-10-CM | POA: Diagnosis not present

## 2020-12-16 DIAGNOSIS — E538 Deficiency of other specified B group vitamins: Secondary | ICD-10-CM | POA: Diagnosis not present

## 2020-12-16 DIAGNOSIS — M1990 Unspecified osteoarthritis, unspecified site: Secondary | ICD-10-CM | POA: Diagnosis not present

## 2020-12-16 DIAGNOSIS — Z7901 Long term (current) use of anticoagulants: Secondary | ICD-10-CM | POA: Diagnosis not present

## 2020-12-16 DIAGNOSIS — Z8673 Personal history of transient ischemic attack (TIA), and cerebral infarction without residual deficits: Secondary | ICD-10-CM | POA: Diagnosis not present

## 2020-12-16 DIAGNOSIS — H9190 Unspecified hearing loss, unspecified ear: Secondary | ICD-10-CM | POA: Diagnosis not present

## 2020-12-16 DIAGNOSIS — M47816 Spondylosis without myelopathy or radiculopathy, lumbar region: Secondary | ICD-10-CM | POA: Diagnosis not present

## 2020-12-24 DIAGNOSIS — I480 Paroxysmal atrial fibrillation: Secondary | ICD-10-CM | POA: Diagnosis not present

## 2020-12-24 DIAGNOSIS — G8929 Other chronic pain: Secondary | ICD-10-CM | POA: Diagnosis not present

## 2020-12-24 DIAGNOSIS — M1990 Unspecified osteoarthritis, unspecified site: Secondary | ICD-10-CM | POA: Diagnosis not present

## 2020-12-24 DIAGNOSIS — H9190 Unspecified hearing loss, unspecified ear: Secondary | ICD-10-CM | POA: Diagnosis not present

## 2020-12-24 DIAGNOSIS — Z8673 Personal history of transient ischemic attack (TIA), and cerebral infarction without residual deficits: Secondary | ICD-10-CM | POA: Diagnosis not present

## 2020-12-24 DIAGNOSIS — M5136 Other intervertebral disc degeneration, lumbar region: Secondary | ICD-10-CM | POA: Diagnosis not present

## 2020-12-24 DIAGNOSIS — Z7901 Long term (current) use of anticoagulants: Secondary | ICD-10-CM | POA: Diagnosis not present

## 2020-12-24 DIAGNOSIS — I10 Essential (primary) hypertension: Secondary | ICD-10-CM | POA: Diagnosis not present

## 2020-12-24 DIAGNOSIS — Z87440 Personal history of urinary (tract) infections: Secondary | ICD-10-CM | POA: Diagnosis not present

## 2020-12-24 DIAGNOSIS — N3281 Overactive bladder: Secondary | ICD-10-CM | POA: Diagnosis not present

## 2020-12-24 DIAGNOSIS — Z9181 History of falling: Secondary | ICD-10-CM | POA: Diagnosis not present

## 2020-12-24 DIAGNOSIS — Z87891 Personal history of nicotine dependence: Secondary | ICD-10-CM | POA: Diagnosis not present

## 2020-12-24 DIAGNOSIS — H6123 Impacted cerumen, bilateral: Secondary | ICD-10-CM | POA: Diagnosis not present

## 2020-12-24 DIAGNOSIS — K219 Gastro-esophageal reflux disease without esophagitis: Secondary | ICD-10-CM | POA: Diagnosis not present

## 2020-12-24 DIAGNOSIS — M810 Age-related osteoporosis without current pathological fracture: Secondary | ICD-10-CM | POA: Diagnosis not present

## 2020-12-24 DIAGNOSIS — E538 Deficiency of other specified B group vitamins: Secondary | ICD-10-CM | POA: Diagnosis not present

## 2020-12-24 DIAGNOSIS — M47816 Spondylosis without myelopathy or radiculopathy, lumbar region: Secondary | ICD-10-CM | POA: Diagnosis not present

## 2020-12-24 DIAGNOSIS — D6869 Other thrombophilia: Secondary | ICD-10-CM | POA: Diagnosis not present

## 2020-12-24 DIAGNOSIS — E039 Hypothyroidism, unspecified: Secondary | ICD-10-CM | POA: Diagnosis not present

## 2020-12-29 DIAGNOSIS — M1990 Unspecified osteoarthritis, unspecified site: Secondary | ICD-10-CM | POA: Diagnosis not present

## 2020-12-29 DIAGNOSIS — Z8673 Personal history of transient ischemic attack (TIA), and cerebral infarction without residual deficits: Secondary | ICD-10-CM | POA: Diagnosis not present

## 2020-12-29 DIAGNOSIS — M5136 Other intervertebral disc degeneration, lumbar region: Secondary | ICD-10-CM | POA: Diagnosis not present

## 2020-12-29 DIAGNOSIS — Z9181 History of falling: Secondary | ICD-10-CM | POA: Diagnosis not present

## 2020-12-29 DIAGNOSIS — N3281 Overactive bladder: Secondary | ICD-10-CM | POA: Diagnosis not present

## 2020-12-29 DIAGNOSIS — Z87440 Personal history of urinary (tract) infections: Secondary | ICD-10-CM | POA: Diagnosis not present

## 2020-12-29 DIAGNOSIS — Z7689 Persons encountering health services in other specified circumstances: Secondary | ICD-10-CM | POA: Diagnosis not present

## 2020-12-29 DIAGNOSIS — M810 Age-related osteoporosis without current pathological fracture: Secondary | ICD-10-CM | POA: Diagnosis not present

## 2020-12-29 DIAGNOSIS — K219 Gastro-esophageal reflux disease without esophagitis: Secondary | ICD-10-CM | POA: Diagnosis not present

## 2020-12-29 DIAGNOSIS — M47816 Spondylosis without myelopathy or radiculopathy, lumbar region: Secondary | ICD-10-CM | POA: Diagnosis not present

## 2020-12-29 DIAGNOSIS — I10 Essential (primary) hypertension: Secondary | ICD-10-CM | POA: Diagnosis not present

## 2020-12-29 DIAGNOSIS — G8929 Other chronic pain: Secondary | ICD-10-CM | POA: Diagnosis not present

## 2020-12-29 DIAGNOSIS — Z7901 Long term (current) use of anticoagulants: Secondary | ICD-10-CM | POA: Diagnosis not present

## 2020-12-29 DIAGNOSIS — D6869 Other thrombophilia: Secondary | ICD-10-CM | POA: Diagnosis not present

## 2020-12-29 DIAGNOSIS — E538 Deficiency of other specified B group vitamins: Secondary | ICD-10-CM | POA: Diagnosis not present

## 2020-12-29 DIAGNOSIS — E039 Hypothyroidism, unspecified: Secondary | ICD-10-CM | POA: Diagnosis not present

## 2020-12-29 DIAGNOSIS — Z87891 Personal history of nicotine dependence: Secondary | ICD-10-CM | POA: Diagnosis not present

## 2020-12-29 DIAGNOSIS — H9190 Unspecified hearing loss, unspecified ear: Secondary | ICD-10-CM | POA: Diagnosis not present

## 2020-12-29 DIAGNOSIS — H6123 Impacted cerumen, bilateral: Secondary | ICD-10-CM | POA: Diagnosis not present

## 2020-12-29 DIAGNOSIS — I480 Paroxysmal atrial fibrillation: Secondary | ICD-10-CM | POA: Diagnosis not present

## 2020-12-31 DIAGNOSIS — R413 Other amnesia: Secondary | ICD-10-CM | POA: Diagnosis not present

## 2020-12-31 DIAGNOSIS — R140 Abdominal distension (gaseous): Secondary | ICD-10-CM | POA: Diagnosis not present

## 2021-01-26 ENCOUNTER — Encounter (HOSPITAL_BASED_OUTPATIENT_CLINIC_OR_DEPARTMENT_OTHER): Payer: Self-pay | Admitting: *Deleted

## 2021-01-26 ENCOUNTER — Emergency Department (HOSPITAL_BASED_OUTPATIENT_CLINIC_OR_DEPARTMENT_OTHER): Payer: Medicare Other | Admitting: Radiology

## 2021-01-26 ENCOUNTER — Emergency Department (HOSPITAL_BASED_OUTPATIENT_CLINIC_OR_DEPARTMENT_OTHER)
Admission: EM | Admit: 2021-01-26 | Discharge: 2021-01-26 | Disposition: A | Discharge status: Home / Self Care | Payer: Medicare Other | Attending: Emergency Medicine | Admitting: Emergency Medicine

## 2021-01-26 ENCOUNTER — Other Ambulatory Visit: Payer: Self-pay

## 2021-01-26 ENCOUNTER — Emergency Department (HOSPITAL_BASED_OUTPATIENT_CLINIC_OR_DEPARTMENT_OTHER): Payer: Medicare Other

## 2021-01-26 DIAGNOSIS — E039 Hypothyroidism, unspecified: Secondary | ICD-10-CM | POA: Diagnosis not present

## 2021-01-26 DIAGNOSIS — F0390 Unspecified dementia without behavioral disturbance: Secondary | ICD-10-CM | POA: Insufficient documentation

## 2021-01-26 DIAGNOSIS — W010XXA Fall on same level from slipping, tripping and stumbling without subsequent striking against object, initial encounter: Secondary | ICD-10-CM | POA: Insufficient documentation

## 2021-01-26 DIAGNOSIS — M25532 Pain in left wrist: Secondary | ICD-10-CM | POA: Diagnosis not present

## 2021-01-26 DIAGNOSIS — S52502A Unspecified fracture of the lower end of left radius, initial encounter for closed fracture: Secondary | ICD-10-CM | POA: Diagnosis not present

## 2021-01-26 DIAGNOSIS — Z87891 Personal history of nicotine dependence: Secondary | ICD-10-CM | POA: Insufficient documentation

## 2021-01-26 DIAGNOSIS — I10 Essential (primary) hypertension: Secondary | ICD-10-CM | POA: Diagnosis not present

## 2021-01-26 DIAGNOSIS — I4891 Unspecified atrial fibrillation: Secondary | ICD-10-CM | POA: Diagnosis not present

## 2021-01-26 DIAGNOSIS — S22080A Wedge compression fracture of T11-T12 vertebra, initial encounter for closed fracture: Secondary | ICD-10-CM | POA: Insufficient documentation

## 2021-01-26 DIAGNOSIS — S6992XA Unspecified injury of left wrist, hand and finger(s), initial encounter: Secondary | ICD-10-CM | POA: Diagnosis present

## 2021-01-26 DIAGNOSIS — S52352A Displaced comminuted fracture of shaft of radius, left arm, initial encounter for closed fracture: Secondary | ICD-10-CM | POA: Diagnosis not present

## 2021-01-26 DIAGNOSIS — R0789 Other chest pain: Secondary | ICD-10-CM | POA: Insufficient documentation

## 2021-01-26 DIAGNOSIS — Z043 Encounter for examination and observation following other accident: Secondary | ICD-10-CM | POA: Diagnosis not present

## 2021-01-26 DIAGNOSIS — S22000A Wedge compression fracture of unspecified thoracic vertebra, initial encounter for closed fracture: Secondary | ICD-10-CM | POA: Diagnosis not present

## 2021-01-26 DIAGNOSIS — M545 Low back pain, unspecified: Secondary | ICD-10-CM | POA: Diagnosis not present

## 2021-01-26 DIAGNOSIS — Z7901 Long term (current) use of anticoagulants: Secondary | ICD-10-CM | POA: Insufficient documentation

## 2021-01-26 DIAGNOSIS — R079 Chest pain, unspecified: Secondary | ICD-10-CM | POA: Diagnosis not present

## 2021-01-26 DIAGNOSIS — Z79899 Other long term (current) drug therapy: Secondary | ICD-10-CM | POA: Diagnosis not present

## 2021-01-26 IMAGING — DX DG LUMBAR SPINE COMPLETE 4+V
5 series · 5 of 5 positions shown · non-contrast
Comparison: None.

CLINICAL DATA: Fall, pain

EXAM:
LUMBAR SPINE - COMPLETE 4+ VIEW

[l-spine ap]
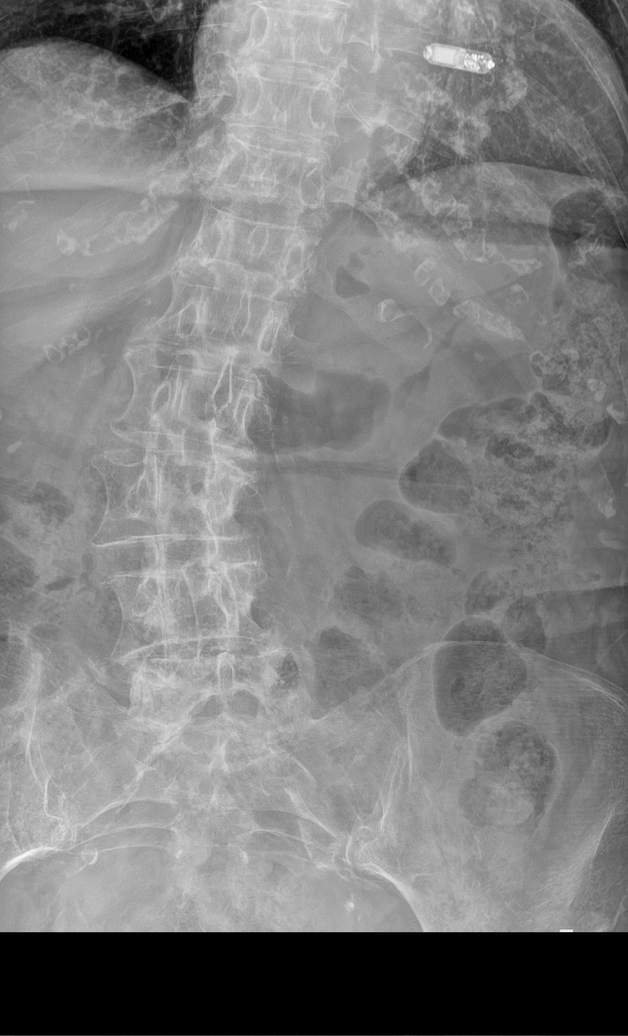

[l-spine obl (1 of 2)]
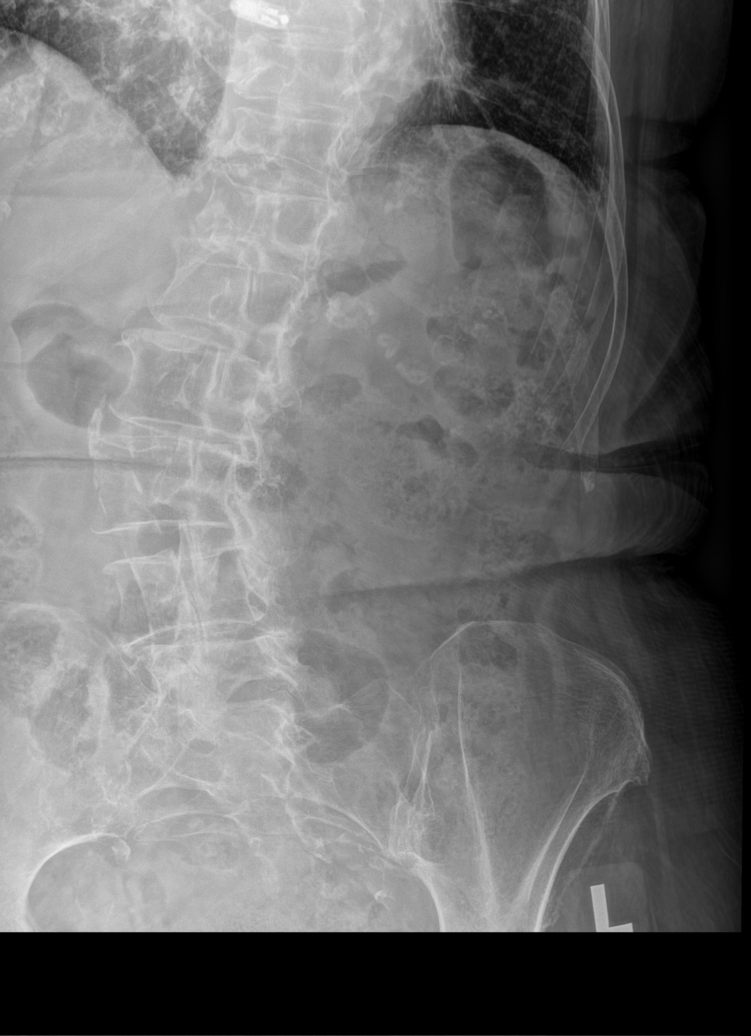

[l-spine obl (2 of 2)]
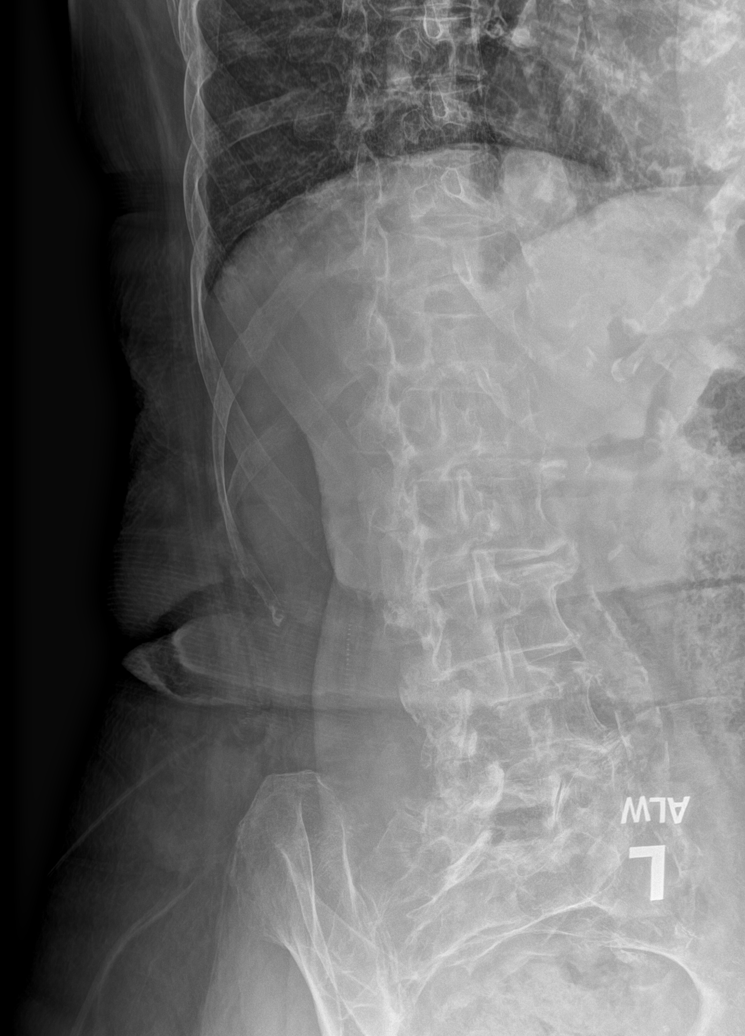

[l-spine lat]
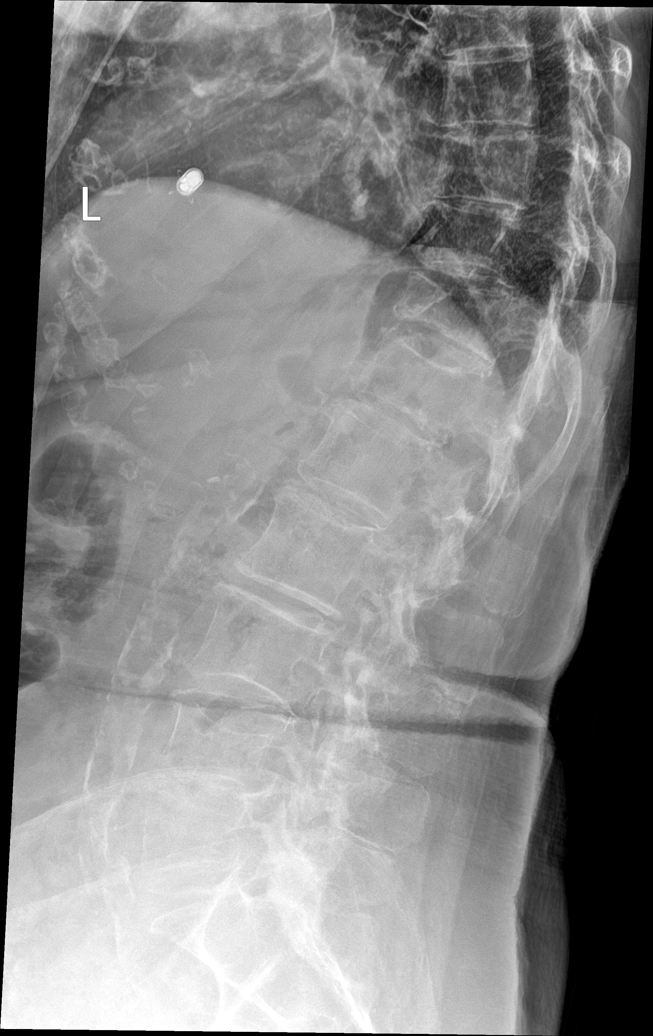

[l-spine spot]
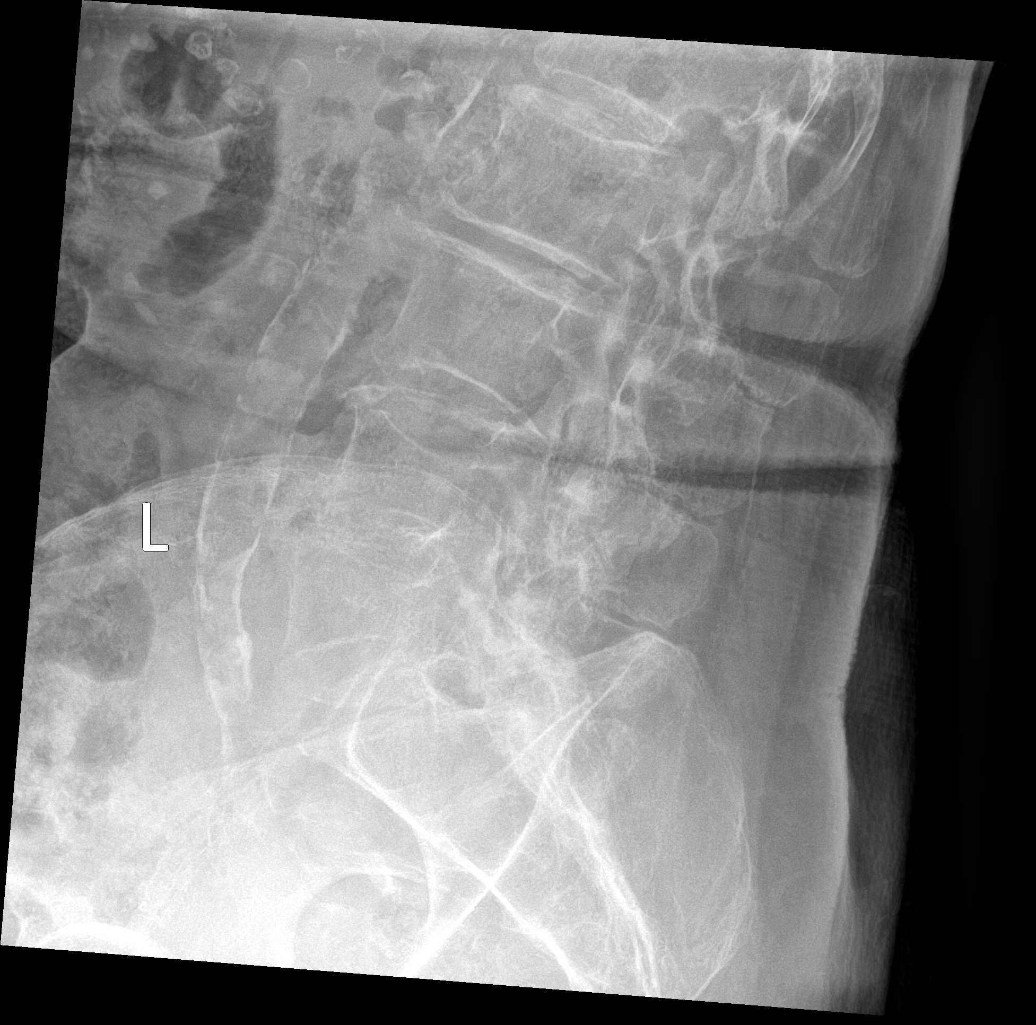

[5 of 5 positions shown; findings below may reference images not displayed]

FINDINGS: Diffuse osteopenia. Age-indeterminate mild compression fracture at
T11. Mild compression deformity through the superior endplate of L4
also age indeterminate. No subluxation. Rightward scoliosis and
associated degenerative disc and facet disease.
IMPRESSION: Age-indeterminate mild compression fracture at T11 and through the
superior endplate at L4.

Diffuse osteopenia.

Scoliosis and degenerative changes.

## 2021-01-26 IMAGING — DX DG WRIST COMPLETE 3+V*L*
4 series · 4 of 4 positions shown · non-contrast
Comparison: None.

CLINICAL DATA: Fall.  Pain.

EXAM:
LEFT WRIST - COMPLETE 3+ VIEW

[wrist ap]
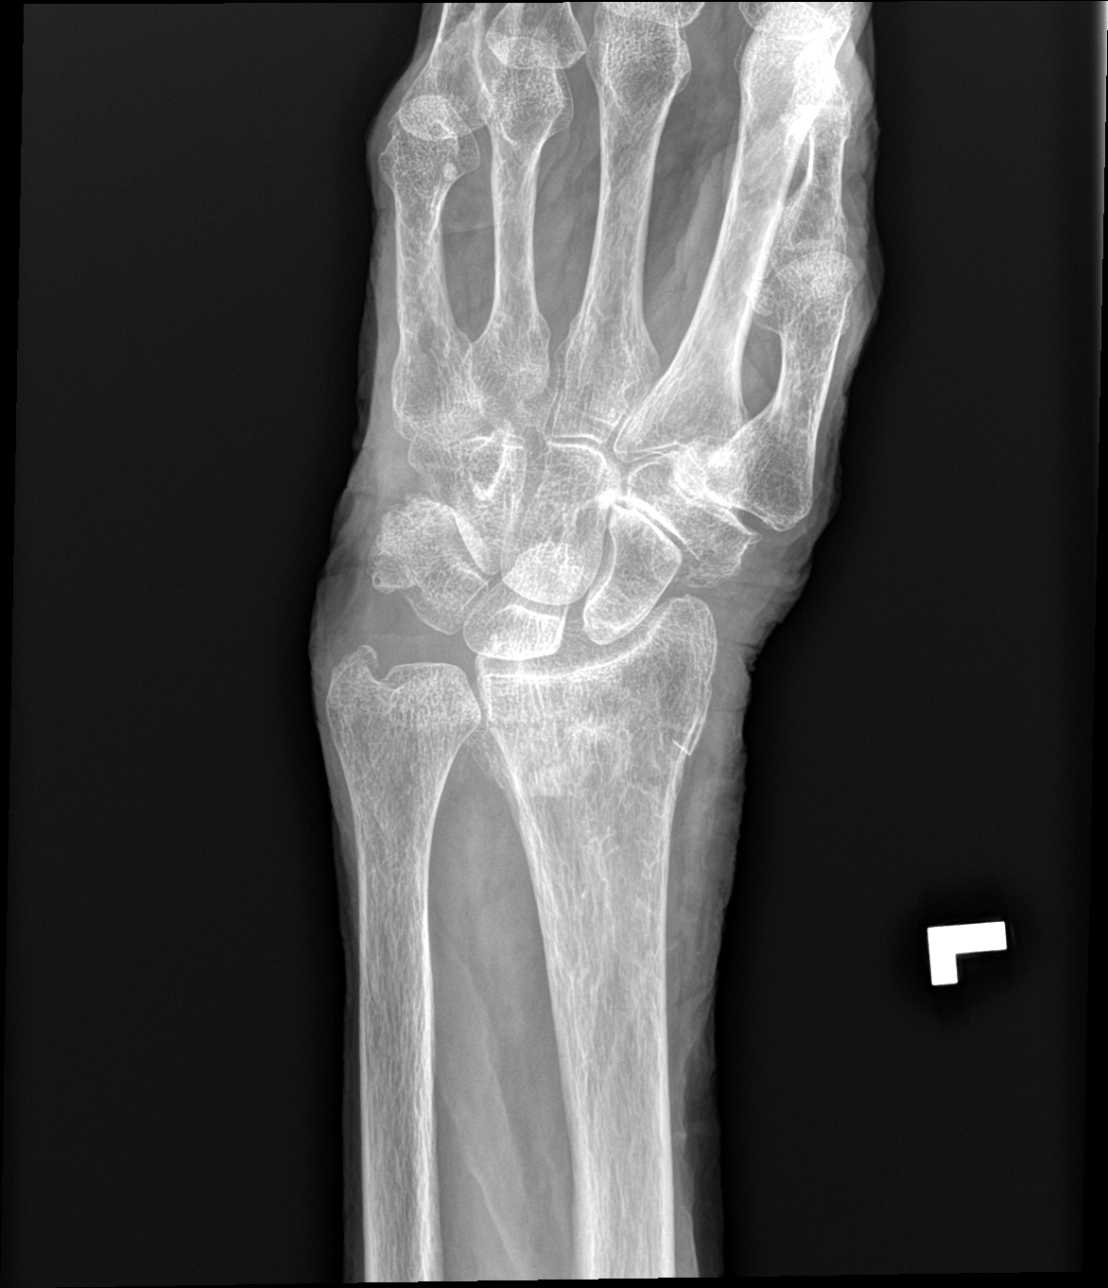

[wrist obl]
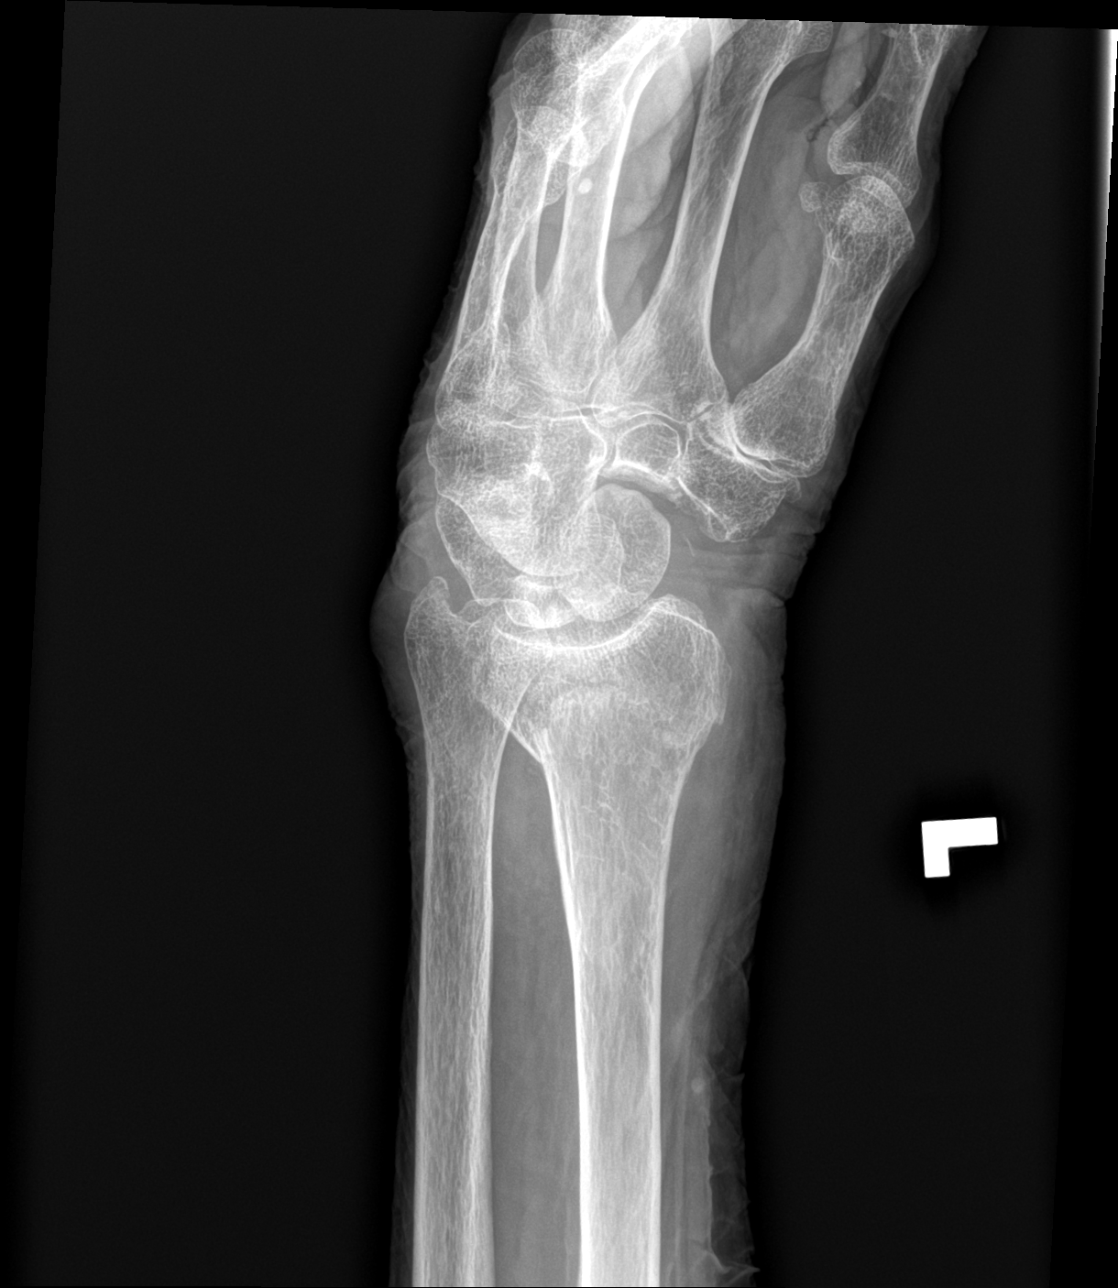

[wrist lat]
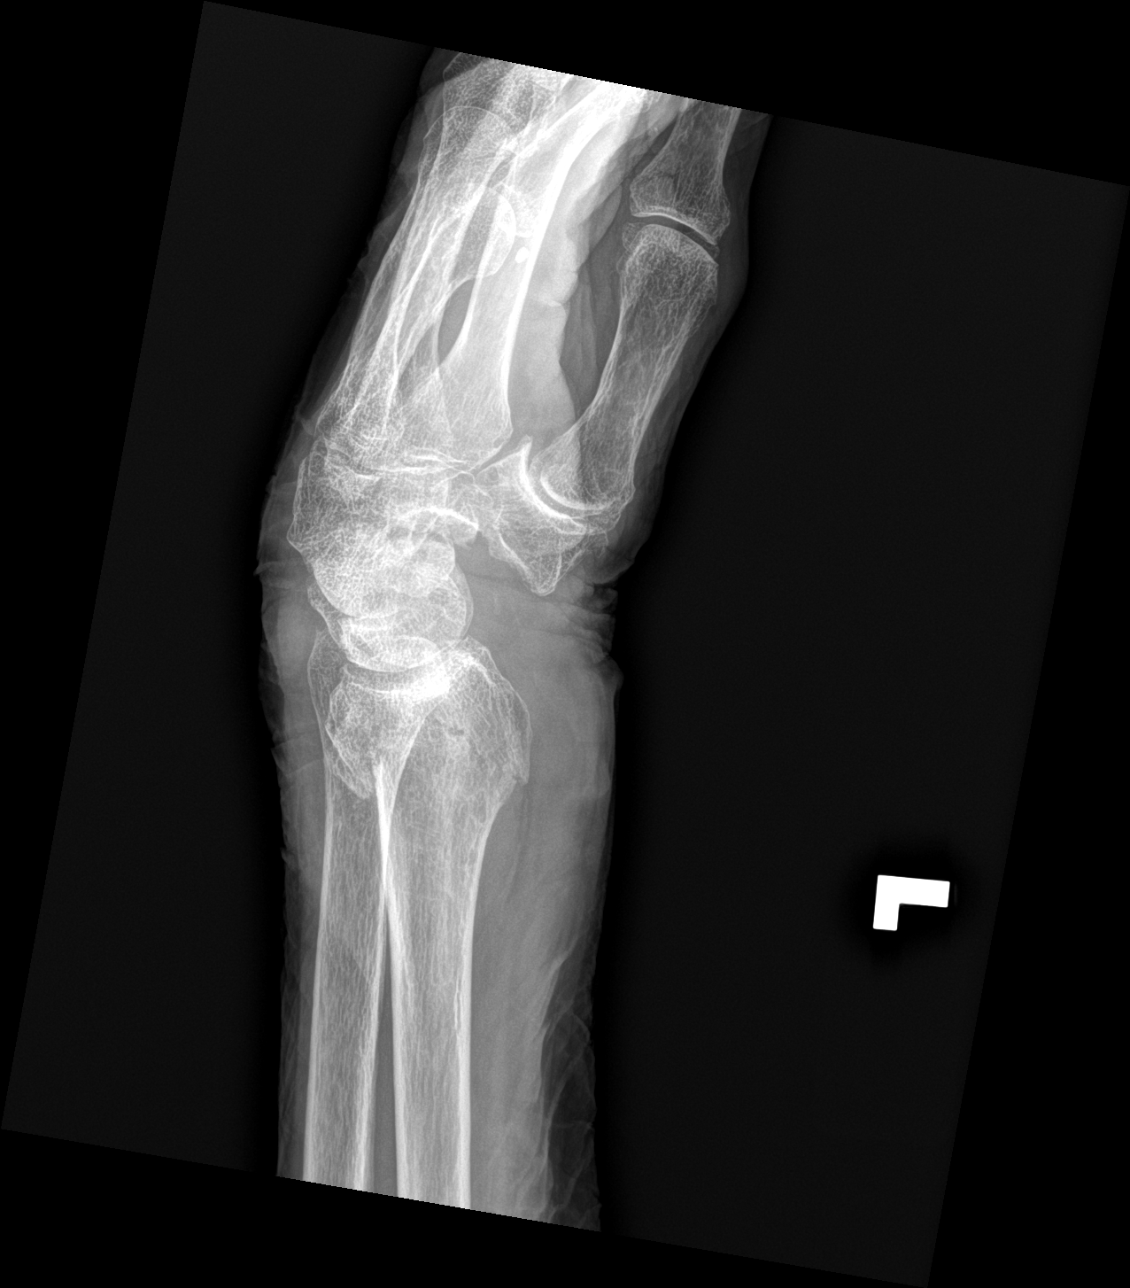

[wrist navicular]
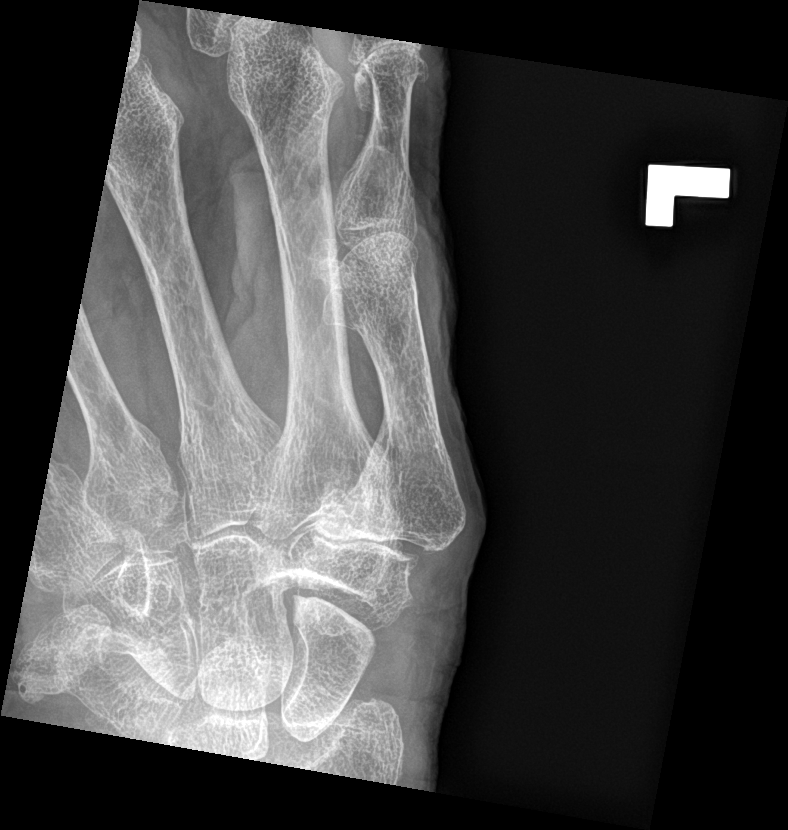

[4 of 4 positions shown; findings below may reference images not displayed]

FINDINGS: Three views study shows diffuse osteopenia. There is a comminuted
fracture of the distal radial metaphysis with apex anterior
angulation. No definite extension to the articular surface although
assessment is hindered by demineralization. Degenerative changes are
seen in the radial carpus and first carpometacarpal joint. Probable
tiny ulnar styloid fracture.
IMPRESSION: 1. Comminuted fracture of the distal radial metaphysis with apex
anterior angulation. No definite intra-articular extension although
assessment hindered by demineralization.
2. Probable tiny ulnar styloid fracture.

## 2021-01-26 IMAGING — CT CT T SPINE W/O CM
2 of 3 series · 10 of 33 positions shown, 12 images · non-contrast
Comparison: [DATE]

CLINICAL DATA: Fall, lower thoracic compression shown on chest
radiograph.

EXAM:
CT THORACIC SPINE WITHOUT CONTRAST
TECHNIQUE: Multidetector CT images of the thoracic were obtained using the
standard protocol without intravenous contrast.

[Series 6: t spine soft · axial · 0.29mm/px · z∈[-48,+236]mm · 7 of 168 slices shown, 9 images]
[im 13/168  soft-tissue]
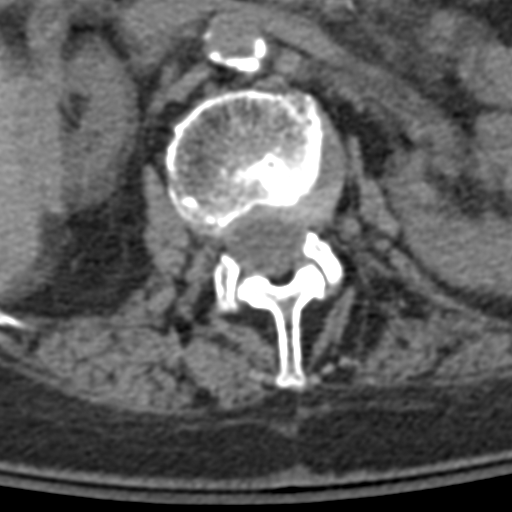
[im 13/168  bone]
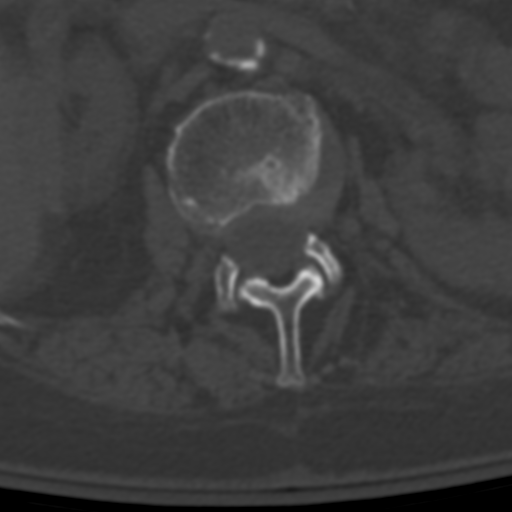
[im 39/168  bone]
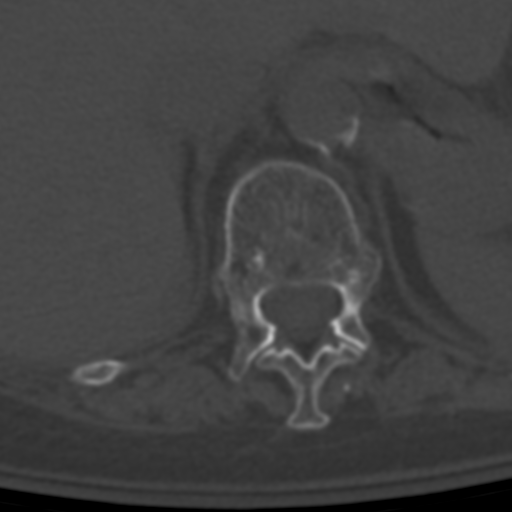
[im 65/168  bone]
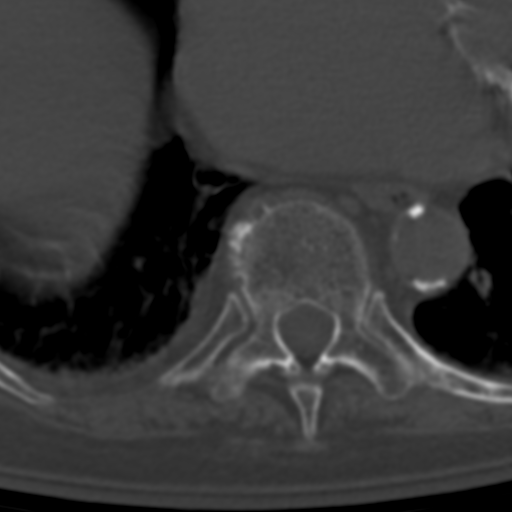
[im 90/168  bone]
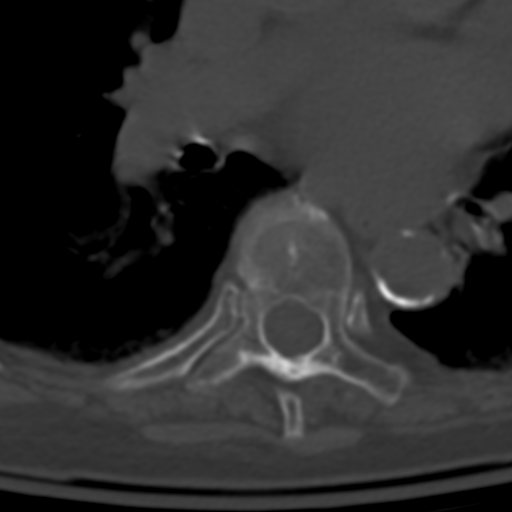
[im 103/168  soft-tissue]
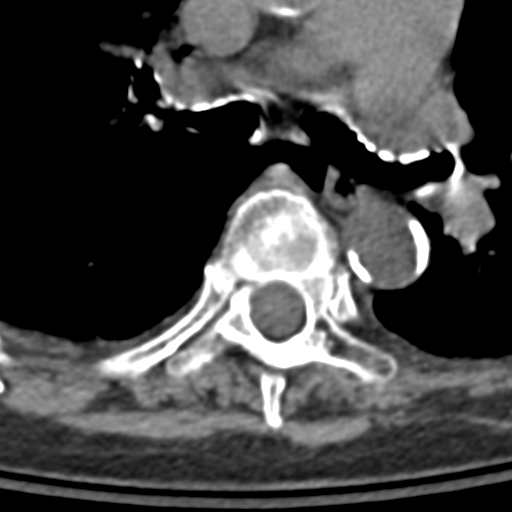
[im 103/168  bone]
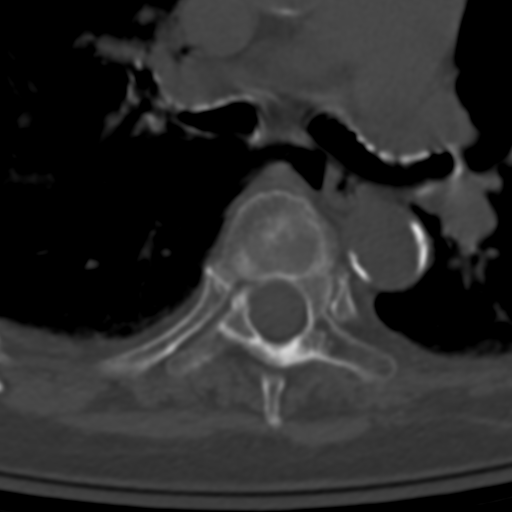
[im 129/168  bone]
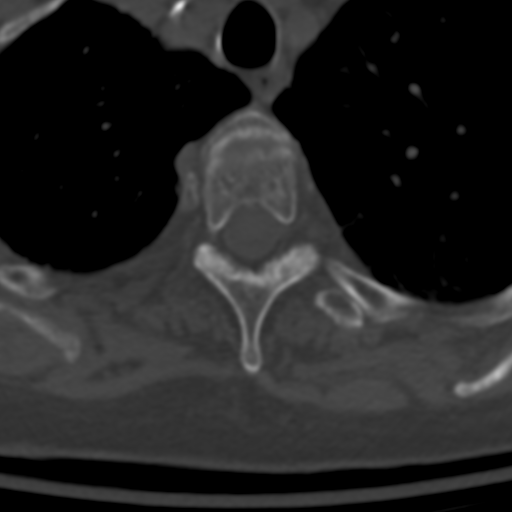
[im 155/168  bone]
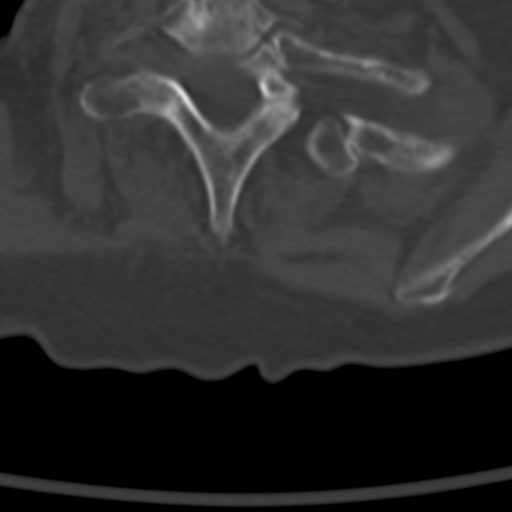

[Series 7: cor bone · coronal · 0.23mm/px · 3 of 81 slices shown]
[im 17/81  bone]
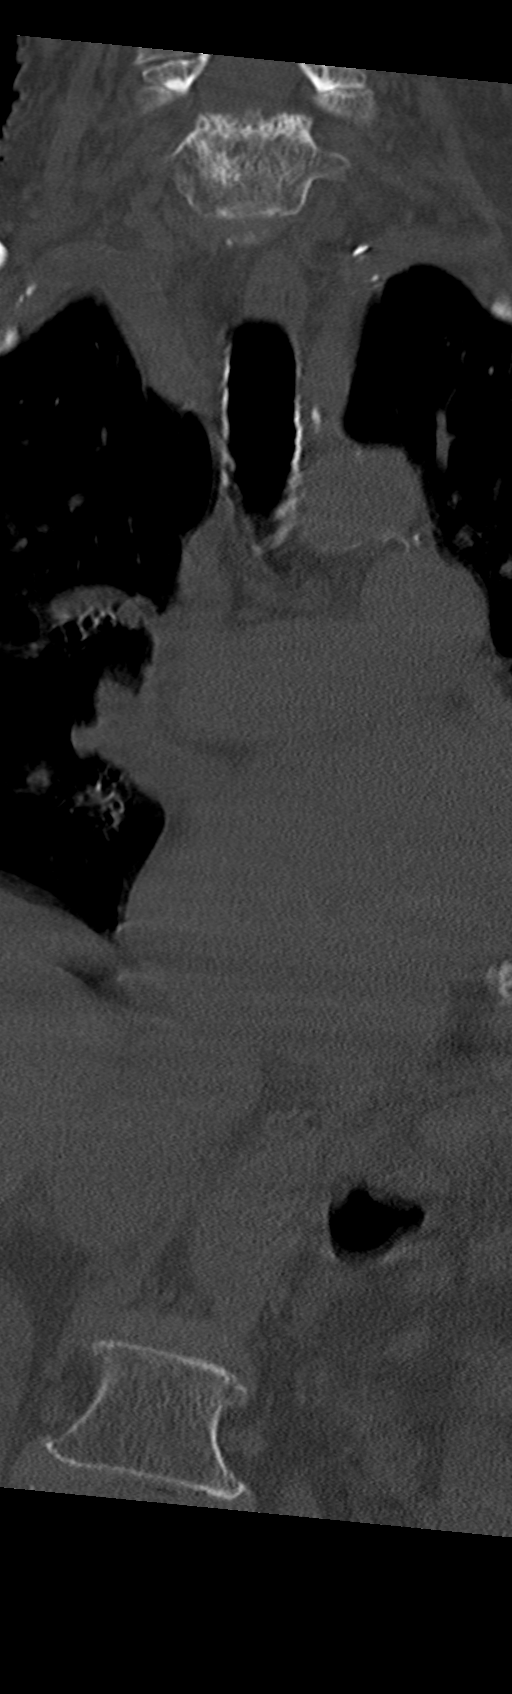
[im 33/81  bone]
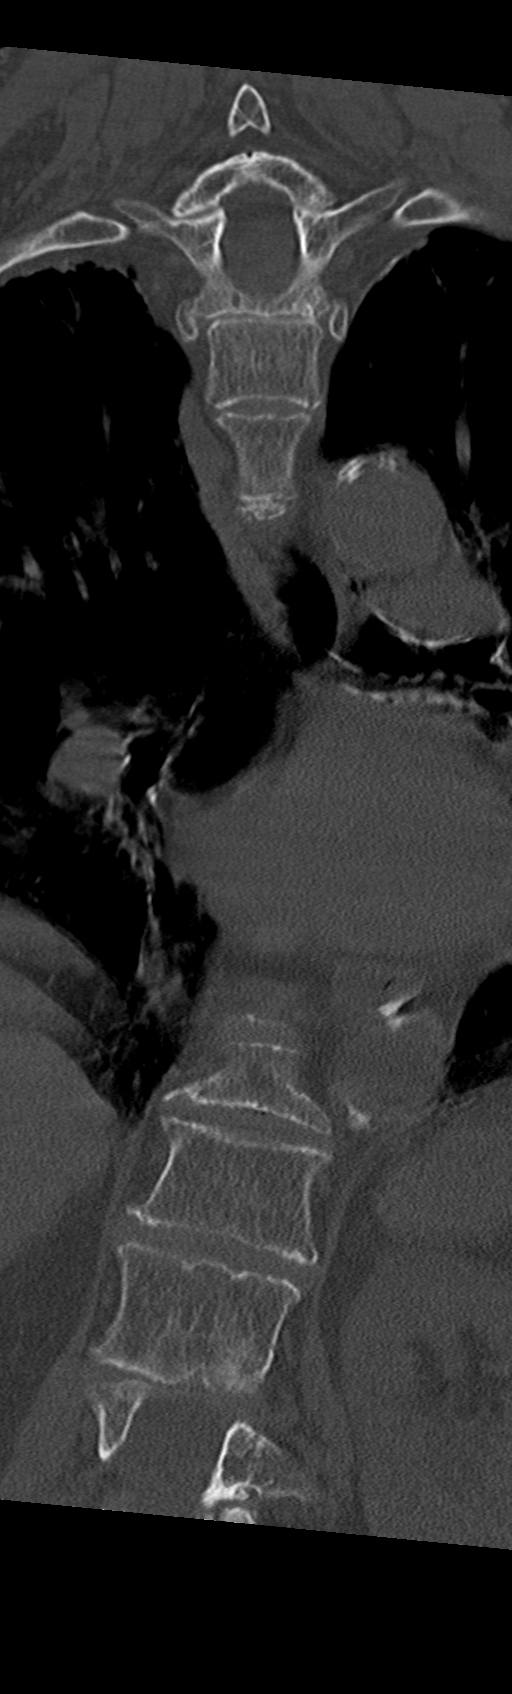
[im 49/81  bone]
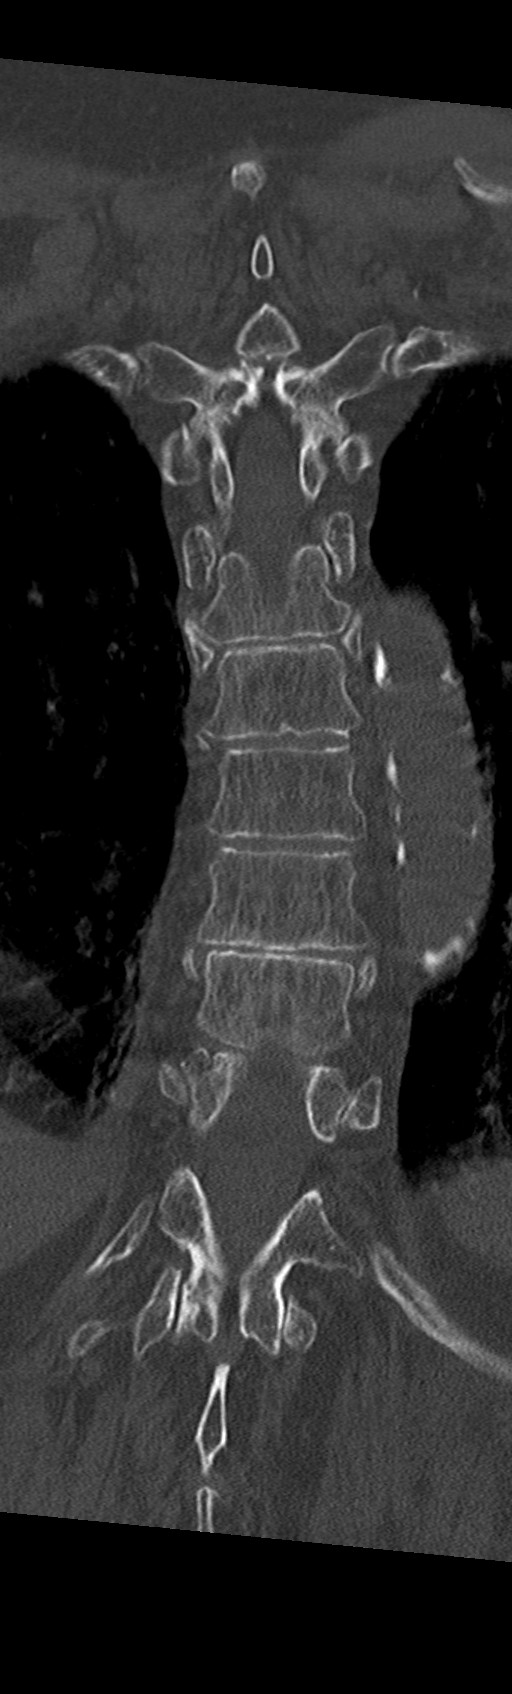

[10 of 33 positions shown; findings below may reference images not displayed]

FINDINGS: Alignment: 2 mm of grade 1 anterolisthesis of C7 on T1. 2 mm
degenerative retrolisthesis at T12-L1.

Vertebrae: 40% primarily superior endplate compression fracture at
T11, probably chronic given the lack paraspinal edema lack of
well-defined fracture plane, although assessment is somewhat
complicated by a Schmorl's node along the superior endplate. Is
likely Schmorl's node along the inferior endplate with a small
amount of gas along the T11-12 intervertebral disc partially
extending into the vertebral body on image 24 series 8.

There are additional minor and chronic appearing endplate
compressions along the superior endplate of T5; superior endplate of
T9; and superior endplate of T12. Again, none of these appear to
have a substantial amount of paraspinal edema to indicate an acute
fracture, although MRI can provide some greater diagnostic
specificity with regard to subacute fractures.

Mild levoconvex thoracic scoliosis. Old healed fracture of the right
medial eleventh rib. Left eccentric hemangioma in the L5 vertebral
body.

Paraspinal and other soft tissues: Centrilobular emphysema.
Atherosclerosis including the aorta and branch vessels. Mitral valve
calcification. Trace right pleural effusion. On the scout image
there appears to be a loop recorder.

Disc levels: Uncinate and facet spurring appear to probably cause
left foraminal impingement at the C6-7 level. No other impingement
identified.
IMPRESSION: 1. 40% primarily superior endplate compression fracture at T11 with
several additional small endplate fractures. Overall these fractures
appear to be chronic, without associated paraspinal edema or
well-defined cortical discontinuity to specifically indicate an
acute fracture. MRI can provide some increase in diagnostic
specificity with regard to timing if clinically warranted.
2. Left foraminal impingement at C6-7 due to spurring.
3. Aortic Atherosclerosis ([I6]-[I6]) and Emphysema ([I6]-[I6]).
Mitral valve calcification.
4. Trace right pleural effusion.

## 2021-01-26 IMAGING — DX DG CHEST 2V
2 series · 2 of 2 positions shown · non-contrast
Comparison: Chest radiograph [DATE]

CLINICAL DATA: Fall, pain

EXAM:
CHEST - 2 VIEW

[chest pa]
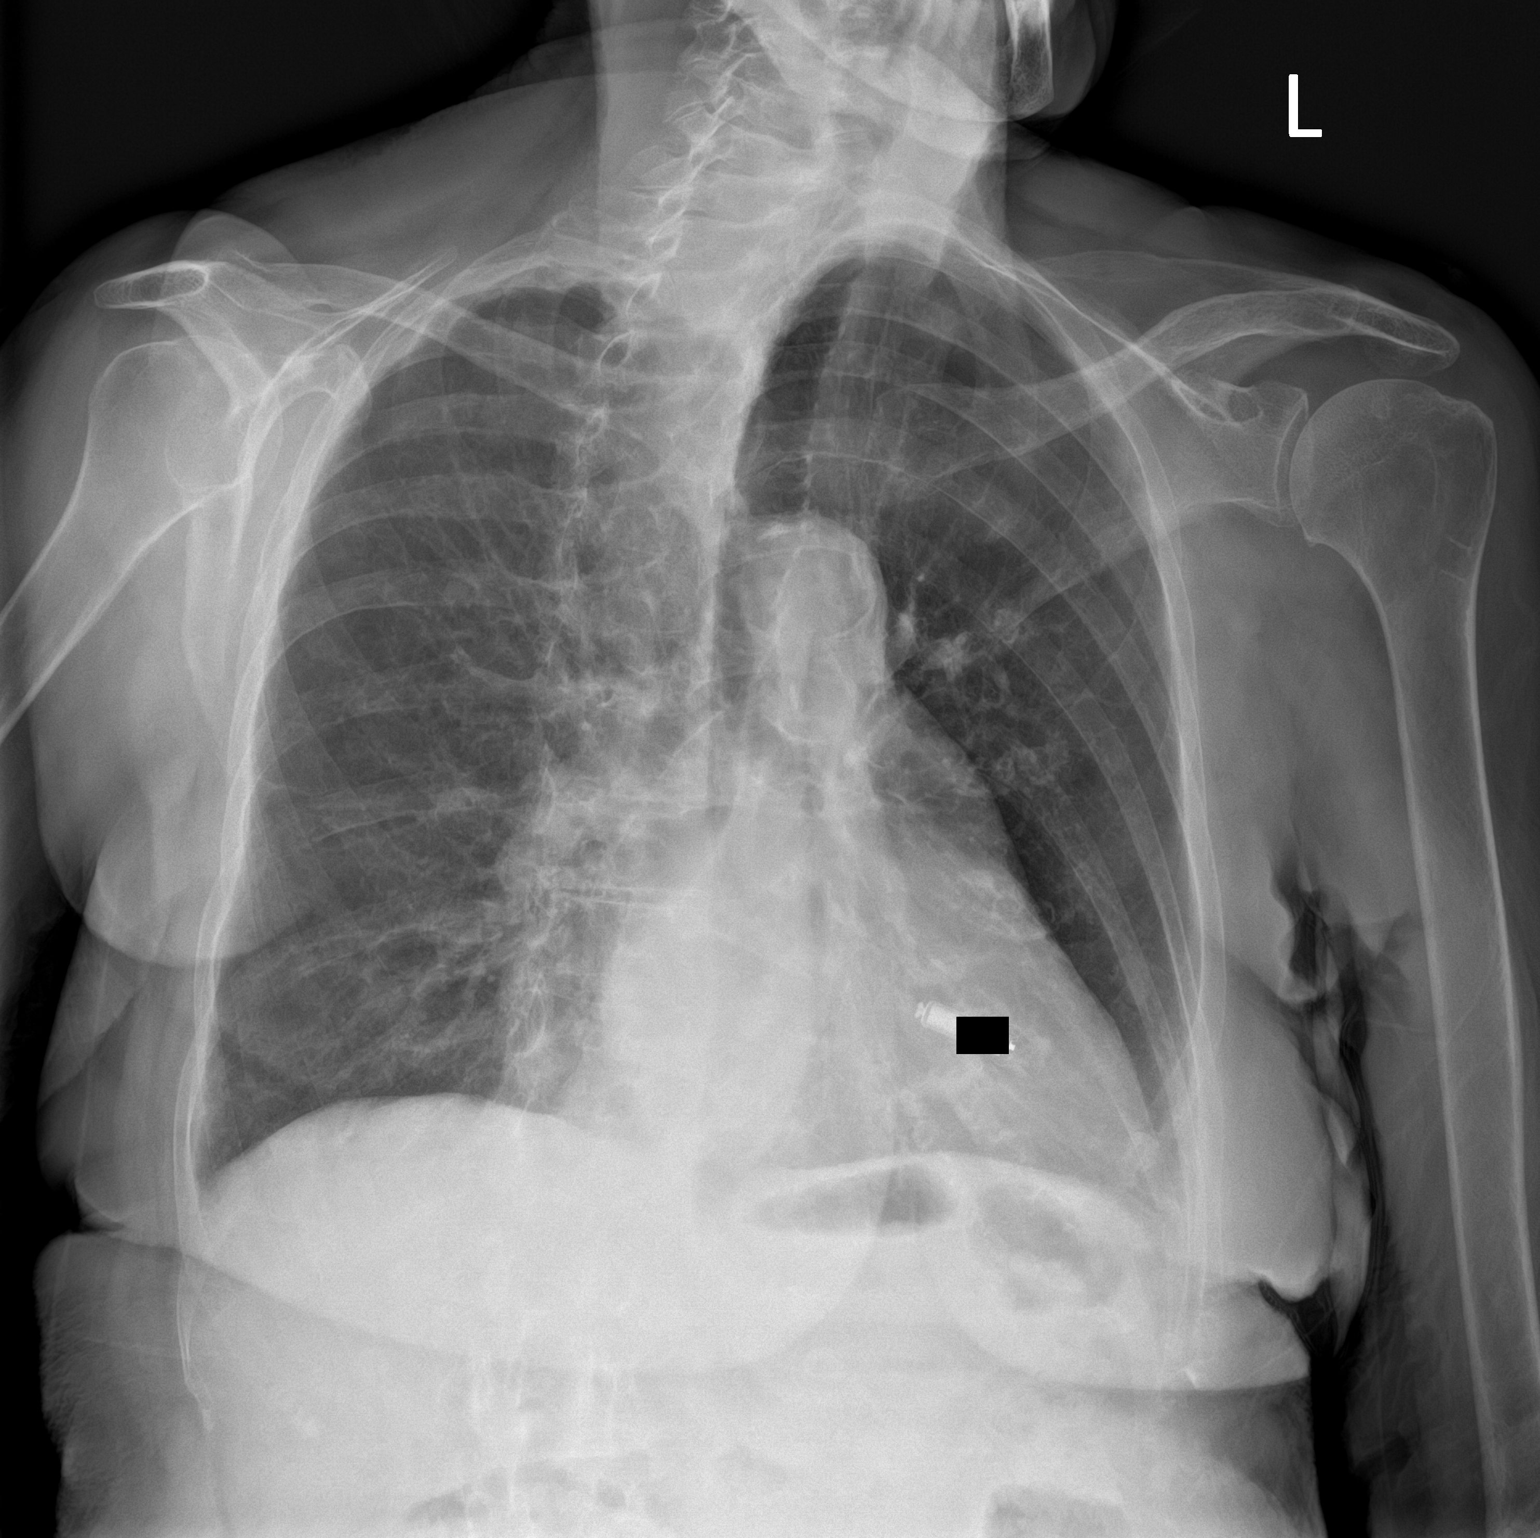

[chest lat]
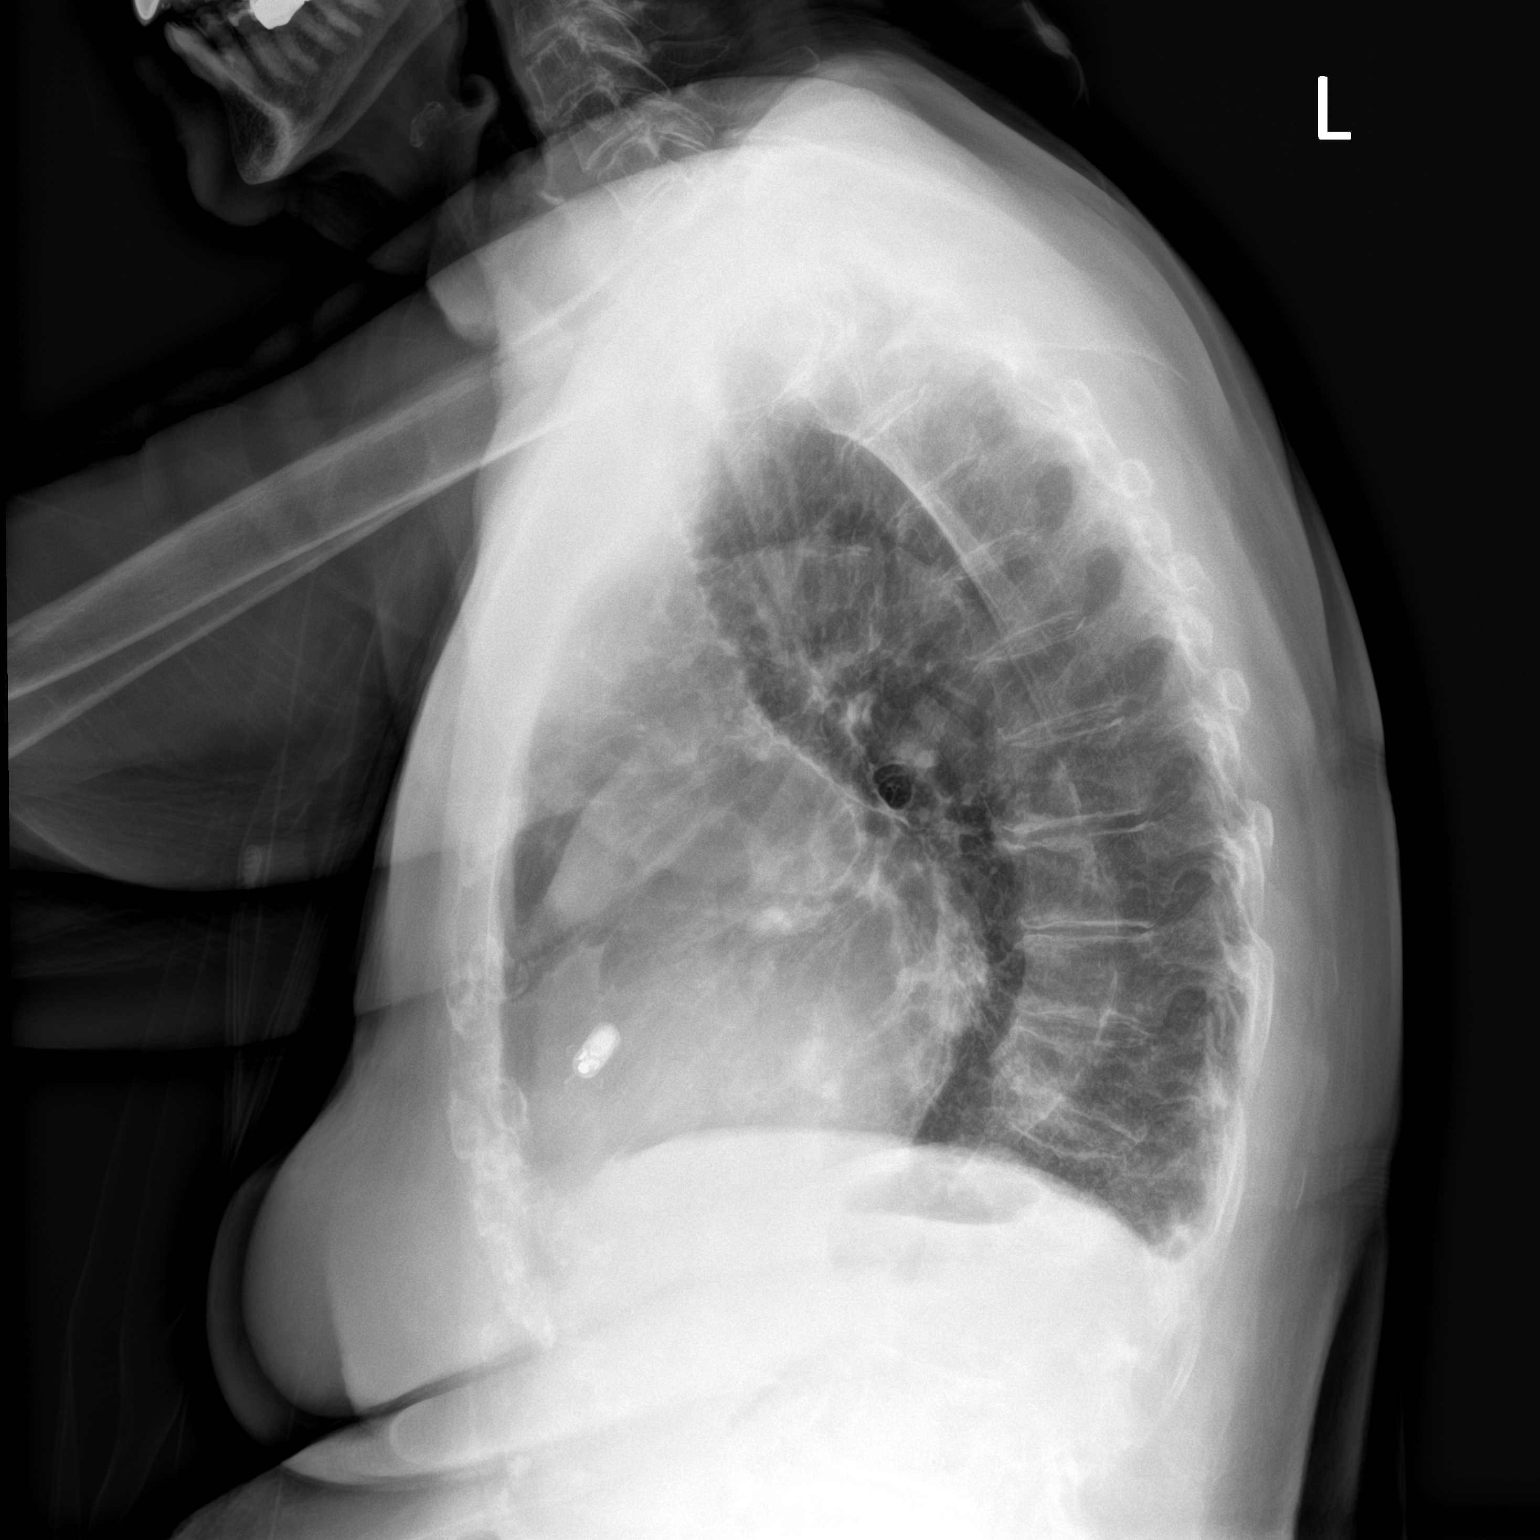

[2 of 2 positions shown; findings below may reference images not displayed]

FINDINGS: Patient is significantly rotated to the left

A cardiac device projects over the heart. The cardiomediastinal
silhouette is within normal limits, allowing for patient rotation.

There is no focal consolidation or pulmonary edema. There is
blunting of the left costophrenic angle which may reflect a trace
pleural effusion.

There is multilevel degenerative change of the thoracic spine. There
is age-indeterminate compression deformity of a lower thoracic
vertebral body with approximately 40% loss of vertebral body height.
IMPRESSION: 1. Age-indeterminate compression deformity of a lower thoracic
vertebral body with approximately 40% loss of vertebral body height.
Correlate with point tenderness.
2. Suspected trace left pleural effusion.

## 2021-01-26 MED ORDER — TRAMADOL HCL 50 MG PO TABS
50.0000 mg | ORAL_TABLET | Freq: Once | ORAL | Status: AC
  Administered 2021-01-26: 50 mg via ORAL
  Filled 2021-01-26: qty 1

## 2021-01-26 MED ORDER — TRAMADOL HCL 50 MG PO TABS: 50.0000 mg | ORAL_TABLET | Freq: Four times a day (QID) | ORAL | 0 refills | Status: DC | PRN

## 2021-01-26 MED ORDER — ACETAMINOPHEN 500 MG PO TABS
1000.0000 mg | ORAL_TABLET | Freq: Once | ORAL | Status: AC
  Administered 2021-01-26: 1000 mg via ORAL
  Filled 2021-01-26: qty 2

## 2021-01-26 NOTE — ED Notes (Signed)
Patient transported to CT 

## 2021-01-26 NOTE — Discharge Instructions (Addendum)
It was our pleasure to provide your ER care today - we hope that you feel better.  For wrist fracture, follow up with orthopedic hand specialist in the coming week - see attached office info - call for appointment. Wear splint. Elevate wrist.  As wrist injury will complicate use of walker, use fall precautions - use extreme caution and/or assistance with walking to help minimize risk of falling. Also consider getting wheelchair to assist with mobilization/transport.   For T11 compression fracture, follow up with spine specialist in the coming week -  see attached info - call office for appointment.   Follow up with primary care doctor for routine health maintenance and recheck of blood pressure which is high today.   Take acetaminophen as need. You may also take ultram as need for pain.  Return to ER if worse, new symptoms, fevers, new, severe or intractable pain, numbness/weakness, or other concern.

## 2021-01-26 NOTE — ED Triage Notes (Signed)
Pt reports trip and fall as she was reaching for the door handle this morning, injury to left wrist with swelling, left elbow, back and rt side. Had to have assistance to get up. No LOC

## 2021-01-26 NOTE — ED Notes (Signed)
Pt had bitten tongue on fall. Distal end of Tongue was slightly bruised with a small laceration. Tongue was not swollen and did NOT affect speech or ability to talk or take fluids and medication PO.  Pt denied pain to her tongue and stated that she was unaware that she had bitten her tongue.   Caregiver aware and pt had been thoroughly checked by Dr. Denton Lank with initial assessment

## 2021-01-26 NOTE — ED Provider Notes (Signed)
[facility] Provider Note   CSN: [id number] Arrival date & time: 01/26/21  2671     History Chief Complaint  Patient presents with   Fall   Wrist Injury    Emma Yang is a 87 y.o. female.  Pt s/p fall this AM with left wrist pain. Symptoms acute onset, episodic. States did not have walker w her and tripped, fell forward. C/o left wrist pain - acute onset, dull, moderate, worse w palpation. Skin is intact. Also w chest wall tenderness and low back pain. No radicular pain. No numbness/weakness. Denies head injury or headache. No anticoag use. No loc. No nausea/vomiting. No neck pain. Low back pain, hx same. No numbness/weakness. No other extremity pain or injury. No faintness or dizziness.   The history is provided by the patient, a relative and medical records.  Fall Pertinent negatives include no chest pain, no abdominal pain, no headaches and no shortness of breath.  Wrist Injury Associated symptoms: no fever and no neck pain       Past Medical History:  Diagnosis Date   A-fib ([place])    POST CARDIOVERSION   Allergic rhinitis    Anemia    Anticoagulant long-term use    Anxiety associated with depression    Atrial flutter (HCC)    Autonomic disorder    ANS   BPPV (benign paroxysmal positional vertigo)    UNSPECIFIED LATERALITY   Choreoathetosis    Depression    Dizziness    Falls    Fluency disorder following cerebral infarction    General weakness    DEBILITY   GERD (gastroesophageal reflux disease)    Hypertension    Hypomagnesemia    Hyponatremia    Hypothyroid    Low back pain without sciatica    UNSPECIFIED BACK PAIN LATERALITY, UNSPECIFIED CHRONICITY   MDD (major depressive disorder), recurrent episode, moderate (HCC)    Orthostatic hypotension    Rash    Recurrent cystitis    Recurrent falls    Seborrheic dermatitis    TIA (transient ischemic attack)    Vasomotor rhinitis    Vertigo     Patient Active Problem  List   Diagnosis Date Noted   Vascular dementia without behavioral disturbance ([place]) 11/28/2018   Vertigo    TIA (transient ischemic attack)    Orthostatic hypotension    Hypothyroid    Hypotension    Hypertension    Falls    Dizziness    Depression    BPPV (benign paroxysmal positional vertigo)    Autonomic disorder    A-fib ([facility])     Past Surgical History:  Procedure Laterality Date   ABLATION     cardiac   CARDIOVERSION  2018   KNEE SURGERY     WRIST SURGERY       OB History   No obstetric history on file.     Family History  Problem Relation Age of Onset   Hypotension Neg Hx     Social History   Tobacco Use   Smoking status: Former    Types: Cigarettes    Quit date: 04/20/1967    Years since quitting: 53.8   Smokeless tobacco: Never  Vaping Use   Vaping Use: Never used  Substance Use Topics   Alcohol use: Not Currently    Comment: quit 09/2017   Drug use: Never    Home Medications Prior to Admission medications   Medication Sig Start Date End Date Taking? Authorizing Provider  apixaban (ELIQUIS) 2.5 MG TABS tablet Take 2.5 mg by mouth 2 (two) times daily.    Yes [provider]  cephALEXin (KEFLEX) 250 MG capsule Take by mouth 1 day or 1 dose.   Yes [provider]  Cholecalciferol (VITAMIN D-3 PO) Take by mouth. 2000u by mouth daily   Yes [provider]  levothyroxine (SYNTHROID, LEVOTHROID) 25 MCG tablet Take 25 mcg by mouth daily before breakfast.  07/17/18  Yes [provider]  losartan (COZAAR) 50 MG tablet Take 50 mg by mouth at bedtime.  01/16/18  Yes [provider]  omeprazole (PRILOSEC) 20 MG capsule Take 20 mg by mouth daily.    Yes [provider]  vitamin B-12 (CYANOCOBALAMIN) 1000 MCG tablet Take 1,000 mcg by mouth daily.    Yes [provider]  carbamide peroxide (GNP EARWAX REMOVAL KIT) 6.5 % OTIC solution 2 drops. Into the affected ear once per week as needed for ear wax  impaction.    [provider]  Dentifrices (BIOTENE DRY MOUTH) PSTE Place onto teeth. Provide to use as toothpaste once daily.    [provider]  dofetilide (TIKOSYN) 250 MCG capsule Take 250 mcg by mouth 2 (two) times daily.    [provider]  ferrous sulfate 325 (65 FE) MG tablet Take 325 mg by mouth 2 (two) times daily with a meal.     [provider]  fluticasone (FLONASE) 50 MCG/ACT nasal spray Place 1 spray into both nostrils daily.  06/02/18   [provider]  ketoconazole (NIZORAL) 2 % shampoo Apply 1 application topically 2 (two) times a week.    [provider]  meclizine (ANTIVERT) 25 MG tablet Take 25 mg by mouth 2 (two) times daily.    [provider]  nitrofurantoin, macrocrystal-monohydrate, (MACROBID) 100 MG capsule Take 100 mg by mouth every 12 (twelve) hours. For 7 days.    [provider]  oxybutynin (DITROPAN) 5 MG tablet Take 5 mg by mouth every 6 (six) hours.    Welford Roche, NP  UNABLE TO FIND Place 3 drops into both ears 3 (three) times a week. Med Name: [facility]    [provider]    Allergies    Formaldehyde, Other, Levaquin [levofloxacin], and Tape  Review of Systems   Review of Systems  Constitutional:  Negative for fever.  HENT:  Negative for nosebleeds.   Eyes:  Negative for redness.  Respiratory:  Negative for shortness of breath.   Cardiovascular:  Negative for chest pain.  Gastrointestinal:  Negative for abdominal pain and vomiting.  Genitourinary:  Negative for flank pain.  Musculoskeletal:  Negative for neck pain.  Skin:  Negative for wound.  Neurological:  Negative for headaches.  Hematological:  Does not bruise/bleed easily.  Psychiatric/Behavioral:  Negative for confusion.    Physical Exam Updated Vital Signs BP (!) 160/98 (BP Location: Right Arm)   Pulse 89   Temp (!) 97.5 F (36.4 C) (Oral)   Resp (!) 21   Ht 1.676 m (5' 6")   Wt 63.5 kg   SpO2 96%    BMI 22.60 kg/m   Physical Exam Vitals and nursing note reviewed.  Constitutional:      Appearance: Normal appearance. She is well-developed.  HENT:     Head: Atraumatic.     Nose: Nose normal.     Mouth/Throat:     Mouth: Mucous membranes are moist.  Eyes:     General: No scleral icterus.  Conjunctiva/sclera: Conjunctivae normal.     Pupils: Pupils are equal, round, and reactive to light.  Neck:     Trachea: No tracheal deviation.  Cardiovascular:     Rate and Rhythm: Normal rate.     Pulses: Normal pulses.  Pulmonary:     Effort: Pulmonary effort is normal. No respiratory distress.     Comments: Mild anterior chest wall tenderness. No crepitus.  Abdominal:     General: There is no distension.     Palpations: Abdomen is soft.     Tenderness: There is no abdominal tenderness.  Genitourinary:    Comments: No cva tenderness.  Musculoskeletal:     Cervical back: Normal range of motion and neck supple. No rigidity. No muscular tenderness.     Comments: Mild swelling and sts left wrist. Skin intact. Radial pulse  2 +. No other focal bony tenderness on bilateral extremity exam.   Upper lumbar tenderness, otherwise, CTLS spine, non tender, aligned, no step off.   Skin:    General: Skin is warm and dry.     Findings: No rash.  Neurological:     Mental Status: She is alert.     Comments: Alert, speech normal. GCS 15. Motor/sens grossly intact bil. Left hand nvi.   Psychiatric:        Mood and Affect: Mood normal.    ED Results / Procedures / Treatments   Labs (all labs ordered are listed, but only abnormal results are displayed) Labs Reviewed - No data to display  EKG None  Radiology DG Chest 2 View  Result Date: 01/26/2021 CLINICAL DATA:  Fall, pain EXAM: CHEST - 2 VIEW COMPARISON:  Chest radiograph 02/22/2019 FINDINGS: Patient is significantly rotated to the left A cardiac device projects over the heart. The cardiomediastinal silhouette is within normal limits,  allowing for patient rotation. There is no focal consolidation or pulmonary edema. There is blunting of the left costophrenic angle which may reflect a trace pleural effusion. There is multilevel degenerative change of the thoracic spine. There is age-indeterminate compression deformity of a lower thoracic vertebral body with approximately 40% loss of vertebral body height. IMPRESSION: 1. Age-indeterminate compression deformity of a lower thoracic vertebral body with approximately 40% loss of vertebral body height. Correlate with point tenderness. 2. Suspected trace left pleural effusion. Electronically Signed   By: Peter  Noone M.D.   On: 01/26/2021 11:17   DG Lumbar Spine Complete  Result Date: 01/26/2021 CLINICAL DATA:  Fall, pain EXAM: LUMBAR SPINE - COMPLETE 4+ VIEW COMPARISON:  None. FINDINGS: Diffuse osteopenia. Age-indeterminate mild compression fracture at T11. Mild compression deformity through the superior endplate of L4 also age indeterminate. No subluxation. Rightward scoliosis and associated degenerative disc and facet disease. IMPRESSION: Age-indeterminate mild compression fracture at T11 and through the superior endplate at L4. Diffuse osteopenia. Scoliosis and degenerative changes. Electronically Signed   By:   [place] M.D.   On: 01/26/2021 11:17   DG Wrist Complete Left  Result Date: 01/26/2021 CLINICAL DATA:  Fall.  Pain. EXAM: LEFT WRIST - COMPLETE 3+ VIEW COMPARISON:  None. FINDINGS: Three views study shows diffuse osteopenia. There is a comminuted fracture of the distal radial metaphysis with apex anterior angulation. No definite extension to the articular surface although assessment is hindered by demineralization. Degenerative changes are seen in the radial carpus and first carpometacarpal joint. Probable tiny ulnar styloid fracture. IMPRESSION: 1. Comminuted fracture of the distal radial metaphysis with apex anterior angulation. No definite intra-articular extension although  assessment hindered by   demineralization. 2. Probable tiny ulnar styloid fracture. Electronically Signed   By: Eric  Mansell M.D.   On: 01/26/2021 11:13   CT Thoracic Spine Wo Contrast  Result Date: 01/26/2021 CLINICAL DATA:  Fall, lower thoracic compression shown on chest radiograph. EXAM: CT THORACIC SPINE WITHOUT CONTRAST TECHNIQUE: Multidetector CT images of the thoracic were obtained using the standard protocol without intravenous contrast. COMPARISON:  01/26/2021 FINDINGS: Alignment: 2 mm of grade 1 anterolisthesis of C7 on T1. 2 mm degenerative retrolisthesis at T12-L1. Vertebrae: 40% primarily superior endplate compression fracture at T11, probably chronic given the lack paraspinal edema lack of well-defined fracture plane, although assessment is somewhat complicated by a Schmorl's node along the superior endplate. Is likely Schmorl's node along the inferior endplate with a small amount of gas along the T11-12 intervertebral disc partially extending into the vertebral body on image 24 series 8. There are additional minor and chronic appearing endplate compressions along the superior endplate of T5; superior endplate of T9; and superior endplate of T12. Again, none of these appear to have a substantial amount of paraspinal edema to indicate an acute fracture, although MRI can provide some greater diagnostic specificity with regard to subacute fractures. Mild levoconvex thoracic scoliosis. Old healed fracture of the right medial eleventh rib. Left eccentric hemangioma in the L5 vertebral body. Paraspinal and other soft tissues: Centrilobular emphysema. Atherosclerosis including the aorta and branch vessels. Mitral valve calcification. Trace right pleural effusion. On the scout image there appears to be a loop recorder. Disc levels: Uncinate and facet spurring appear to probably cause left foraminal impingement at the C6-7 level. No other impingement identified. IMPRESSION: 1. 40% primarily superior  endplate compression fracture at T11 with several additional small endplate fractures. Overall these fractures appear to be chronic, without associated paraspinal edema or well-defined cortical discontinuity to specifically indicate an acute fracture. MRI can provide some increase in diagnostic specificity with regard to timing if clinically warranted. 2. Left foraminal impingement at C6-7 due to spurring. 3. Aortic Atherosclerosis (ICD10-I70.0) and Emphysema (ICD10-J43.9). Mitral valve calcification. 4. Trace right pleural effusion. Electronically Signed   By: Walter  Liebkemann M.D.   On: 01/26/2021 12:25    Procedures Procedures   Medications Ordered in ED Medications  acetaminophen (TYLENOL) tablet 1,000 mg (1,000 mg Oral Given 01/26/21 1207)  traMADol (ULTRAM) tablet 50 mg (50 mg Oral Given 01/26/21 1207)    ED Course  I have reviewed the triage vital signs and the nursing notes.  Pertinent labs & imaging results that were available during my care of the patient were reviewed by me and considered in my medical decision making (see chart for details).    MDM Rules/Calculators/A&P                           Imaging ordered.   Reviewed nursing notes and prior charts for additional history.   Xrays reviewed/interpreted by me - left wrist fx. T11 compression fx, age, as pt with recurrent back pain in past. Will get ct.   CT reviewed/interpreted by me - t11 fx.   No radicular pain, no numbness/weakness. Pain controlled.  Pt currently appears stable for d/c.   Rec ortho f/u.  Return precautions provided.        Final Clinical Impression(s) / ED Diagnoses Final diagnoses:  None    Rx / DC Orders ED Discharge Orders     None        , , MD 01/26/21 1308  

## 2021-02-03 ENCOUNTER — Ambulatory Visit: Payer: Medicare Other | Admitting: Orthopedic Surgery

## 2021-02-03 ENCOUNTER — Encounter: Payer: Self-pay | Admitting: Orthopedic Surgery

## 2021-02-03 ENCOUNTER — Other Ambulatory Visit: Payer: Self-pay

## 2021-02-03 ENCOUNTER — Ambulatory Visit: Payer: Self-pay

## 2021-02-03 VITALS — BP 112/69 | HR 69 | Ht 66.0 in | Wt 140.0 lb

## 2021-02-03 DIAGNOSIS — S52532A Colles' fracture of left radius, initial encounter for closed fracture: Secondary | ICD-10-CM

## 2021-02-03 DIAGNOSIS — M25532 Pain in left wrist: Secondary | ICD-10-CM

## 2021-02-03 DIAGNOSIS — S52502A Unspecified fracture of the lower end of left radius, initial encounter for closed fracture: Secondary | ICD-10-CM | POA: Insufficient documentation

## 2021-02-03 MED ORDER — TRAMADOL HCL 50 MG PO TABS: 50.0000 mg | ORAL_TABLET | Freq: Four times a day (QID) | ORAL | 0 refills | Status: AC | PRN

## 2021-02-03 NOTE — Progress Notes (Signed)
Office Visit Note   Patient: Emma Yang           Date of Birth: 1932/05/13           MRN: [id number] Visit Date: 02/03/2021              Requested by: Laurann Montana, MD [phone] [address] [place],  [place] [address] PCP: Laurann Montana, MD   Assessment & Plan: Visit Diagnoses:  1. Pain in left wrist   2. Closed Colles' fracture of left radius, initial encounter     Plan: Discussed with patient and daughter that given her age and physical activity level, we will treat this fracture conservatively in a cast.  Discussed that radiographic parameters do not correlate with long-term functional outcomes in patients in her age group.  We will keep her in a cast for approximately 4 weeks at which time we will take her out of the cast and start hand therapy with the use of a removable Thermoplast brace.  The daughter will call the office if the patient has any complaints regarding pressure areas or discomfort in the cast.  Follow-Up Instructions: No follow-ups on file.   Orders:  Orders Placed This Encounter  Procedures  . XR Wrist Complete Left   Meds ordered this encounter  Medications  . traMADol (ULTRAM) 50 MG tablet    Sig: Take 1 tablet (50 mg total) by mouth every 6 (six) hours as needed for up to 5 days for severe pain.    Dispense:  20 tablet    Refill:  0      Procedures: Casting  Date/Time: 02/03/2021 3:45 PM Performed by: Marlyne Beards, MD Authorized by: Marlyne Beards, MD   Consent Given by:  Patient Location:  Wrist  left wrist Fracture Type: distal radius   Neurovascularly intact   Distal Perfusion: normal   Distal Sensation: normal   Manipulation Performed?: No   Immobilization:  Cast Is this the patient's first cast for this injury?: Yes   Cast Type:  Short arm Supplies Used:  Fiberglass and cotton padding Neurovascularly intact   Distal Perfusion: normal   Distal Sensation: normal   Patient tolerance:  Patient tolerated the  procedure well with no immediate complications   Clinical Data: No additional findings.   Subjective: Chief Complaint  Patient presents with  . Left Wrist - Fracture    RIGHT Handed, +tingling in her finger, + swelling, & weakness, Pain: 7/10. Went to walk out of the door without her walker and fell.    This is an 87 year old right-hand-dominant female who presents as a follow-up [facility] with a closed left distal radius fracture.  She was walking out of the door on the 10th when she had a ground-level fall.  She was not using the walker at the time.  She is seen in the [facility]ppage facility where she was placed in a splint.  Today she complains of continued pain in the wrist.  She has significant swelling and ecchymosis at the volar and dorsal aspect of the wrist.  She previously had numbness and tingling in her fingers but this seems to have resolved per the patient.  She lives with her daughter and does not appear to be reactive.  She is a relatively poor historian and has some difficulty with following directions during our visit.   Review of Systems   Objective: Vital Signs: BP 112/69 (BP Location: Right Arm, Patient Position: Sitting)  Pulse 69   Ht 5\' 6"  (1.676 m)   Wt 140 lb (63.5 kg)   BMI 22.60 kg/m   Physical Exam Cardiovascular:     Pulses: Normal pulses.  Pulmonary:     Effort: Pulmonary effort is normal.  Skin:    General: Skin is warm and dry.     Capillary Refill: Capillary refill takes 2 to 3 seconds.     Findings: Bruising present.  Neurological:     Mental Status: She is alert.    Right Hand Exam   Tenderness  Right hand tenderness location: TTP around volar and dorsal wrist.  Other  Sensation: normal Pulse: present  Comments:  Significant swelling and ecchymosis of the wrist.  Wrist ROM limited by pain. Sensation appears to be intac to light tough per patient though she has some difficulty understand the exam.  She is able to  make an incomplete fist seemingly limited by pain and swelling.      Specialty Comments:  No specialty comments available.  Imaging: Multiple views of the left wrist taken today are reviewed interpreted by me.  They demonstrate a metaphyseal fracture of the distal radius with maintained radial height.  She approximately neutral radial tilt.  She has evidence of severe CMC osteoarthritis.   PMFS History: Patient Active Problem List   Diagnosis Date Noted  . Closed fracture of left distal radius 02/03/2021  . Vascular dementia without behavioral disturbance (HCC) 11/28/2018  . Vertigo   . TIA (transient ischemic attack)   . Orthostatic hypotension   . Hypothyroid   . Hypotension   . Hypertension   . Falls   . Dizziness   . Depression   . BPPV (benign paroxysmal positional vertigo)   . Autonomic disorder   . A-fib [facility])    Past Medical History:  Diagnosis Date  . A-fib (HCC)    POST CARDIOVERSION  . Allergic rhinitis   . Anemia   . Anticoagulant long-term use   . Anxiety associated with depression   . Atrial flutter (HCC)   . Autonomic disorder    ANS  . BPPV (benign paroxysmal positional vertigo)    UNSPECIFIED LATERALITY  . Choreoathetosis   . Depression   . Dizziness   . Falls   . Fluency disorder following cerebral infarction   . General weakness    DEBILITY  . GERD (gastroesophageal reflux disease)   . Hypertension   . Hypomagnesemia   . Hyponatremia   . Hypothyroid   . Low back pain without sciatica    UNSPECIFIED BACK PAIN LATERALITY, UNSPECIFIED CHRONICITY  . MDD (major depressive disorder), recurrent episode, moderate (HCC)   . Orthostatic hypotension   . Rash   . Recurrent cystitis   . Recurrent falls   . Seborrheic dermatitis   . TIA (transient ischemic attack)   . Vasomotor rhinitis   . Vertigo     Family History  Problem Relation Age of Onset  . Hypotension Neg Hx     Past Surgical History:  Procedure Laterality Date  . ABLATION      cardiac  . CARDIOVERSION  2018  . KNEE SURGERY    . WRIST SURGERY     Social History   Occupational History  . Not on file  Tobacco Use  . Smoking status: Former    Types: Cigarettes    Quit date: 04/20/1967    Years since quitting: 53.8  . Smokeless tobacco: Never  Vaping Use  . Vaping Use: Never used  Substance and Sexual Activity  . Alcohol use: Not Currently    Comment: quit 09/2017  . Drug use: Never  . Sexual activity: Not on file

## 2021-02-10 DIAGNOSIS — S32040A Wedge compression fracture of fourth lumbar vertebra, initial encounter for closed fracture: Secondary | ICD-10-CM | POA: Diagnosis not present

## 2021-02-10 DIAGNOSIS — S22040A Wedge compression fracture of fourth thoracic vertebra, initial encounter for closed fracture: Secondary | ICD-10-CM | POA: Diagnosis not present

## 2021-02-10 DIAGNOSIS — S22080A Wedge compression fracture of T11-T12 vertebra, initial encounter for closed fracture: Secondary | ICD-10-CM | POA: Diagnosis not present

## 2021-02-10 DIAGNOSIS — S22080S Wedge compression fracture of T11-T12 vertebra, sequela: Secondary | ICD-10-CM | POA: Diagnosis not present

## 2021-02-11 ENCOUNTER — Other Ambulatory Visit: Payer: Self-pay | Admitting: Neurosurgery

## 2021-02-11 DIAGNOSIS — S22040A Wedge compression fracture of fourth thoracic vertebra, initial encounter for closed fracture: Secondary | ICD-10-CM

## 2021-02-11 DIAGNOSIS — S32040A Wedge compression fracture of fourth lumbar vertebra, initial encounter for closed fracture: Secondary | ICD-10-CM

## 2021-02-20 ENCOUNTER — Other Ambulatory Visit (HOSPITAL_COMMUNITY): Payer: Self-pay | Admitting: Neurosurgery

## 2021-02-20 DIAGNOSIS — M419 Scoliosis, unspecified: Secondary | ICD-10-CM | POA: Diagnosis not present

## 2021-02-20 DIAGNOSIS — Z8739 Personal history of other diseases of the musculoskeletal system and connective tissue: Secondary | ICD-10-CM | POA: Diagnosis not present

## 2021-02-20 DIAGNOSIS — S22040A Wedge compression fracture of fourth thoracic vertebra, initial encounter for closed fracture: Secondary | ICD-10-CM

## 2021-02-20 DIAGNOSIS — S32030A Wedge compression fracture of third lumbar vertebra, initial encounter for closed fracture: Secondary | ICD-10-CM | POA: Diagnosis not present

## 2021-02-20 DIAGNOSIS — S32040A Wedge compression fracture of fourth lumbar vertebra, initial encounter for closed fracture: Secondary | ICD-10-CM | POA: Diagnosis not present

## 2021-03-03 ENCOUNTER — Other Ambulatory Visit: Payer: Self-pay

## 2021-03-03 ENCOUNTER — Ambulatory Visit: Payer: Medicare Other | Admitting: Orthopedic Surgery

## 2021-03-03 ENCOUNTER — Ambulatory Visit (INDEPENDENT_AMBULATORY_CARE_PROVIDER_SITE_OTHER): Payer: Medicare Other

## 2021-03-03 DIAGNOSIS — M25532 Pain in left wrist: Secondary | ICD-10-CM | POA: Diagnosis not present

## 2021-03-03 DIAGNOSIS — S52532A Colles' fracture of left radius, initial encounter for closed fracture: Secondary | ICD-10-CM

## 2021-03-03 NOTE — Progress Notes (Signed)
Office Visit Note   Patient: Emma Yang           Date of Birth: 04/01/1933           MRN: [id number] Visit Date: 03/03/2021              Requested by: Harlan Stains, MD [address] [place],  [place] [address] PCP: Harlan Stains, MD   Assessment & Plan: Visit Diagnoses:  1. Pain in left wrist   2. Closed Colles' fracture of left radius, initial encounter     Plan: Again reviewed the x-rays with the patient and her daugther.  We discussed that radiographic findings do not necessary predict functional outcome in patients in her age group and activity level.  We will put her in a cast for another two weeks as she is still painful at the fracture site.  We will then start hand therapy to work on strengthening and ROM.  I can see them back in two weeks.   Follow-Up Instructions: No follow-ups on file.   Orders:  Orders Placed This Encounter  Procedures   XR Wrist Complete Left   No orders of the defined types were placed in this encounter.     Procedures: No procedures performed   Clinical Data: No additional findings.   Subjective: Chief Complaint  Patient presents with   Left Wrist - Follow-up, Fracture    This is an 87 year old right-hand-dominant female who presents for follow up of a closed left distal radius fracture.  She is approximately a month out from her injury.  She has been in a cast.  She is a poor historian and has her daughter with her.  She has done well in the cast.  She still has some pain w/ certain movements.  She is otherwise without complaint.    Review of Systems   Objective: Vital Signs: There were no vitals taken for this visit.  Physical Exam Cardiovascular:     Rate and Rhythm: Normal rate.     Pulses: Normal pulses.  Pulmonary:     Effort: Pulmonary effort is normal.  Skin:    General: Skin is warm and dry.     Capillary Refill: Capillary refill takes less than 2 seconds.  Neurological:     Mental  Status: She is alert.    Left Hand Exam   Tenderness  Left hand tenderness location: TTP at dorsal distal radius.   Other  Erythema: absent Sensation: normal Pulse: present  Comments:  No skin lesions.  Improved volar and dorsal wrist swelling.  Limited Rom of fingers secondary to stiffness and difficulty following commands.      Specialty Comments:  No specialty comments available.  Imaging: 3V of the left wrist taken today are reviewed and interpreted by me.  They demonstrate unchanged alignment of the distal radius fracture.  She does have adaptive carpal instability at the midcarpal joint with extension of the lunate and flexion of the capitate.    PMFS History: Patient Active Problem List   Diagnosis Date Noted   Closed fracture of left distal radius 02/03/2021   Vascular dementia without behavioral disturbance ([place]) 11/28/2018   Vertigo    TIA (transient ischemic attack)    Orthostatic hypotension    Hypothyroid    Hypotension    Hypertension    Falls    Dizziness    Depression    BPPV (benign paroxysmal positional vertigo)    Autonomic disorder  A-fib [facility])    Past Medical History:  Diagnosis Date   A-fib (HCC)    POST CARDIOVERSION   Allergic rhinitis    Anemia    Anticoagulant long-term use    Anxiety associated with depression    Atrial flutter (HCC)    Autonomic disorder    ANS   BPPV (benign paroxysmal positional vertigo)    UNSPECIFIED LATERALITY   Choreoathetosis    Depression    Dizziness    Falls    Fluency disorder following cerebral infarction    General weakness    DEBILITY   GERD (gastroesophageal reflux disease)    Hypertension    Hypomagnesemia    Hyponatremia    Hypothyroid    Low back pain without sciatica    UNSPECIFIED BACK PAIN LATERALITY, UNSPECIFIED CHRONICITY   MDD (major depressive disorder), recurrent episode, moderate (HCC)    Orthostatic hypotension    Rash    Recurrent cystitis    Recurrent falls     Seborrheic dermatitis    TIA (transient ischemic attack)    Vasomotor rhinitis    Vertigo     Family History  Problem Relation Age of Onset   Hypotension Neg Hx     Past Surgical History:  Procedure Laterality Date   ABLATION     cardiac   CARDIOVERSION  2018   KNEE SURGERY     WRIST SURGERY     Social History   Occupational History   Not on file  Tobacco Use   Smoking status: Former    Types: Cigarettes    Quit date: 04/20/1967    Years since quitting: 53.9   Smokeless tobacco: Never  Vaping Use   Vaping Use: Never used  Substance and Sexual Activity   Alcohol use: Not Currently    Comment: quit 09/2017   Drug use: Never   Sexual activity: Not on file

## 2021-03-16 DIAGNOSIS — R55 Syncope and collapse: Secondary | ICD-10-CM | POA: Diagnosis not present

## 2021-03-16 DIAGNOSIS — Z45018 Encounter for adjustment and management of other part of cardiac pacemaker: Secondary | ICD-10-CM | POA: Diagnosis not present

## 2021-03-17 ENCOUNTER — Ambulatory Visit (INDEPENDENT_AMBULATORY_CARE_PROVIDER_SITE_OTHER): Payer: Medicare Other

## 2021-03-17 ENCOUNTER — Encounter: Payer: Self-pay | Admitting: Orthopedic Surgery

## 2021-03-17 ENCOUNTER — Ambulatory Visit: Payer: Medicare Other | Admitting: Orthopedic Surgery

## 2021-03-17 ENCOUNTER — Other Ambulatory Visit: Payer: Self-pay

## 2021-03-17 DIAGNOSIS — S52532A Colles' fracture of left radius, initial encounter for closed fracture: Secondary | ICD-10-CM

## 2021-03-17 DIAGNOSIS — Z45018 Encounter for adjustment and management of other part of cardiac pacemaker: Secondary | ICD-10-CM | POA: Diagnosis not present

## 2021-03-17 DIAGNOSIS — R55 Syncope and collapse: Secondary | ICD-10-CM | POA: Diagnosis not present

## 2021-03-17 NOTE — Progress Notes (Signed)
Office Visit Note   Patient: Emma Yang           Date of Birth: [birth year]           MRN: [id number] Visit Date: 03/17/2021              Requested by: Laurann Montana, MD [phone] [address] [place],  [place] [address] PCP: Laurann Montana, MD   Assessment & Plan: Visit Diagnoses:  1. Closed Colles' fracture of left radius, initial encounter     Plan: She is approximately 7 weeks out from her fracture. She has minimal pain on exam today.  X-rays with largely unchanged alignment.   We will transition her from the cast to a removable wrist brace.  She should continue to work on finger and wrist ROM.    Follow-Up Instructions: No follow-ups on file.   Orders:  Orders Placed This Encounter  Procedures   XR Wrist Complete Left   No orders of the defined types were placed in this encounter.     Procedures: No procedures performed   Clinical Data: No additional findings.   Subjective: Chief Complaint  Patient presents with   Left Wrist - Fracture, Follow-up    This 87 year old right-hand-dominant female who presents for follow-up of a closed left distal radius fracture.  She is approximately 7 weeks out from her injury at this point.  She has some dementia and lives with her family.  She has very little independent activity.  She has been in a cast since her injury.  She is done well with that.  She has minimal pain today.  She is otherwise without complaint today.   Review of Systems   Objective: Vital Signs: There were no vitals taken for this visit.  Physical Exam  Left Hand Exam   Tenderness  Left hand tenderness location: Minimally TTP at wrist.   Other  Erythema: absent Sensation: normal Pulse: present  Comments:  Skin warm and dry without injury or lesion.  Dorsal wrist swelling slightly improved.  No pain w/ palpation at fracture site.      Specialty Comments:  No specialty comments available.  Imaging: 3 views of the left  wrist taken today reviewed interpreted by me.  She demonstrate no change in fracture alignment.  There is sclerosis at the fracture site suggestive of ongoing healing.  She does have some adaptive carpal instability at the midcarpal joint.   PMFS History: Patient Active Problem List   Diagnosis Date Noted   Closed fracture of left distal radius 02/03/2021   Vascular dementia without behavioral disturbance (HCC) 11/28/2018   Vertigo    TIA (transient ischemic attack)    Orthostatic hypotension    Hypothyroid    Hypotension    Hypertension    Falls    Dizziness    Depression    BPPV (benign paroxysmal positional vertigo)    Autonomic disorder    A-fib (HCC)    Past Medical History:  Diagnosis Date   A-fib (HCC)    POST CARDIOVERSION   Allergic rhinitis    Anemia    Anticoagulant long-term use    Anxiety associated with depression    Atrial flutter (HCC)    Autonomic disorder    ANS   BPPV (benign paroxysmal positional vertigo)    UNSPECIFIED LATERALITY   Choreoathetosis    Depression    Dizziness    Falls    Fluency disorder following cerebral infarction    General weakness  DEBILITY   GERD (gastroesophageal reflux disease)    Hypertension    Hypomagnesemia    Hyponatremia    Hypothyroid    Low back pain without sciatica    UNSPECIFIED BACK PAIN LATERALITY, UNSPECIFIED CHRONICITY   MDD (major depressive disorder), recurrent episode, moderate (HCC)    Orthostatic hypotension    Rash    Recurrent cystitis    Recurrent falls    Seborrheic dermatitis    TIA (transient ischemic attack)    Vasomotor rhinitis    Vertigo     Family History  Problem Relation Age of Onset   Hypotension Neg Hx     Past Surgical History:  Procedure Laterality Date   ABLATION     cardiac   CARDIOVERSION  2018   KNEE SURGERY     WRIST SURGERY     Social History   Occupational History   Not on file  Tobacco Use   Smoking status: Former    Types: Cigarettes    Quit date:  04/20/1967    Years since quitting: 53.9   Smokeless tobacco: Never  Vaping Use   Vaping Use: Never used  Substance and Sexual Activity   Alcohol use: Not Currently    Comment: quit 09/2017   Drug use: Never   Sexual activity: Not on file

## 2021-03-25 ENCOUNTER — Telehealth: Payer: Self-pay

## 2021-03-25 DIAGNOSIS — R410 Disorientation, unspecified: Secondary | ICD-10-CM | POA: Diagnosis not present

## 2021-03-25 DIAGNOSIS — S81812A Laceration without foreign body, left lower leg, initial encounter: Secondary | ICD-10-CM | POA: Diagnosis not present

## 2021-03-25 DIAGNOSIS — R296 Repeated falls: Secondary | ICD-10-CM | POA: Diagnosis not present

## 2021-03-25 DIAGNOSIS — I10 Essential (primary) hypertension: Secondary | ICD-10-CM | POA: Diagnosis not present

## 2021-03-25 DIAGNOSIS — E039 Hypothyroidism, unspecified: Secondary | ICD-10-CM | POA: Diagnosis not present

## 2021-03-25 DIAGNOSIS — F01B4 Vascular dementia, moderate, with anxiety: Secondary | ICD-10-CM | POA: Diagnosis not present

## 2021-03-25 DIAGNOSIS — Z515 Encounter for palliative care: Secondary | ICD-10-CM

## 2021-03-25 DIAGNOSIS — I480 Paroxysmal atrial fibrillation: Secondary | ICD-10-CM | POA: Diagnosis not present

## 2021-03-25 DIAGNOSIS — M810 Age-related osteoporosis without current pathological fracture: Secondary | ICD-10-CM | POA: Diagnosis not present

## 2021-03-25 DIAGNOSIS — Z23 Encounter for immunization: Secondary | ICD-10-CM | POA: Diagnosis not present

## 2021-03-25 NOTE — Telephone Encounter (Signed)
(  3:50 pm) Palliative care SW scheduled initial palliative care visit with patient/daughter. RN/SW visit scheduled for 04/01/21 @ 11am.  Londell Moh at [employer] notified.

## 2021-04-01 ENCOUNTER — Other Ambulatory Visit: Payer: Medicare Other

## 2021-04-01 ENCOUNTER — Other Ambulatory Visit: Payer: Medicare Other | Admitting: *Deleted

## 2021-04-01 ENCOUNTER — Other Ambulatory Visit: Payer: Self-pay

## 2021-04-01 DIAGNOSIS — Z515 Encounter for palliative care: Secondary | ICD-10-CM

## 2021-04-01 NOTE — Progress Notes (Signed)
COMMUNITY PALLIATIVE CARE SW NOTE  PATIENT NAME: Emma Yang DOB: [birth year] MRN: [id number]  PRIMARY CARE PROVIDER: Laurann Montana, MD  RESPONSIBLE PARTY:  Acct ID - Guarantor Home Phone Work Phone Relationship Acct Type  [id number] - Steinberg,GL* [phone]  Self P/F     [address], [place], [place] [address]     PLAN OF CARE and INTERVENTIONS:             GOALS OF CARE/ ADVANCE CARE PLANNING:  Goal is to keep patient at home. Patent is a DNR.  SOCIAL/EMOTIONAL/SPIRITUAL ASSESSMENT/ INTERVENTIONS: SW, RN-M. Howard and NP-K. Highfill completed an initial visit with patient at her home. She was present with her sitter-Stacy, son-in-law and daughter-Gaye (telephonically). The patient/family was provided an education regarding palliative care services. Patient's daughter provided verbal consent to services. The team was provided a status update on patient. Patient ambulates with a rollator. She has had five falls since July that resulted in a broken arm and compression fracture in her back. Patient is having increased confusion, particularly in the morning. Patient's balance is off. She is eating well, but has lost 5.6 lbs  since her last appointment. Patient has pain to her back which she takes an extra-strength Tylenol. She has shortness of breath with ambulation and any exertion. Patient is having increased anxiety, largely due to being frustrated as she is having difficulty expressing herself verbally. Patient has difficulty finding words to complete her sentences and was observed getting tearful when this happened during this visit. Patient's short-term memory is declining, but has good long term memory. Patient is unable to take naps during the day due to increased anxiety. Her sitter and family advise that patient has to be "busy" or moving. Patient is incontinent of urine, mostly at night and occasional accidents during the day. Patient wears depends for additional safety and support.  SOCIAL HISTORY: Patient was born in [place], [place]. She attended [employer]. 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 Patient has three children: Lindaann Slough and Chip. Patient enjoys listening/watching to [employer]. Patient is a DNR, form is in the home; living will  and her POA/HCPOA which Ander Slade and [place] share. PATIENT/CAREGIVER EDUCATION/ COPING:  Patient has great dementia, but is conversive. She has good family support. Patient appears to be coping well.  PERSONAL EMERGENCY PLAN:  911 can be activated for emergencies. COMMUNITY RESOURCES COORDINATION/ HEALTH CARE NAVIGATION:  Patient has a sitter 5 days a week from 9 am-3 pm FINANCIAL/LEGAL CONCERNS/INTERVENTIONS:  No     SOCIAL HX:  Social History   Tobacco Use   Smoking status: Former    Types: Cigarettes    Quit date: 04/20/1967    Years since quitting: 53.9   Smokeless tobacco: Never  Substance Use Topics   Alcohol use: Not Currently    Comment: quit 09/2017    CODE STATUS: DNR ADVANCED DIRECTIVES: Yes MOST FORM COMPLETE: No HOSPICE EDUCATION PROVIDED: NO  PPS: Patient has dementia and is alert and oriented x 2 and is conversive. Patient has a history of falls and remains at risk for falls.   Duration of visit and documentation: 60 minutes.      [address] [place], [place]

## 2021-04-16 ENCOUNTER — Ambulatory Visit (HOSPITAL_COMMUNITY)
Admission: RE | Admit: 2021-04-16 | Discharge: 2021-04-16 | Disposition: A | Discharge status: Home / Self Care | Payer: Medicare Other | Source: Ambulatory Visit | Attending: Neurosurgery | Admitting: Neurosurgery

## 2021-04-16 ENCOUNTER — Other Ambulatory Visit: Payer: Self-pay

## 2021-04-16 ENCOUNTER — Encounter (HOSPITAL_COMMUNITY): Payer: Self-pay

## 2021-04-16 DIAGNOSIS — M549 Dorsalgia, unspecified: Secondary | ICD-10-CM | POA: Diagnosis not present

## 2021-04-16 DIAGNOSIS — S32040A Wedge compression fracture of fourth lumbar vertebra, initial encounter for closed fracture: Secondary | ICD-10-CM | POA: Insufficient documentation

## 2021-04-16 DIAGNOSIS — S22040A Wedge compression fracture of fourth thoracic vertebra, initial encounter for closed fracture: Secondary | ICD-10-CM | POA: Insufficient documentation

## 2021-04-16 DIAGNOSIS — M47814 Spondylosis without myelopathy or radiculopathy, thoracic region: Secondary | ICD-10-CM | POA: Diagnosis not present

## 2021-04-16 IMAGING — MR MR THORACIC SPINE W/O CM
4 of 5 series · 19 of 48 positions shown · non-contrast
Comparison: CT thoracic spine dated [DATE].

CLINICAL DATA: Back pain and weakness.

EXAM:
MRI THORACIC SPINE WITHOUT CONTRAST
TECHNIQUE: Multiplanar, multisequence MR imaging of the thoracic spine was
performed. No intravenous contrast was administered.

[Series 5: T2 · sagittal · 3.0mm · 0.62mm/px · 6 of 14 slices shown (1 of 2)]
[im 1/14]
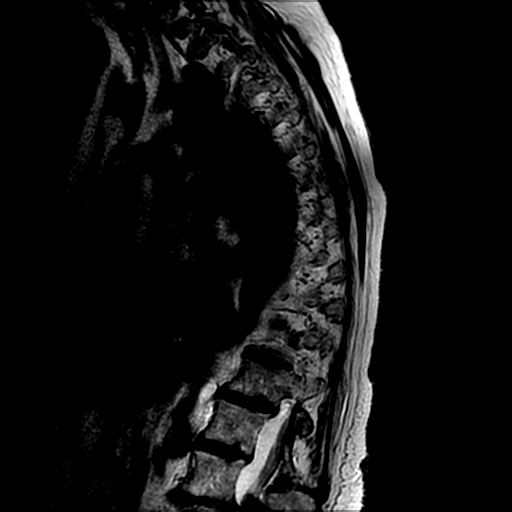
[im 3/14]
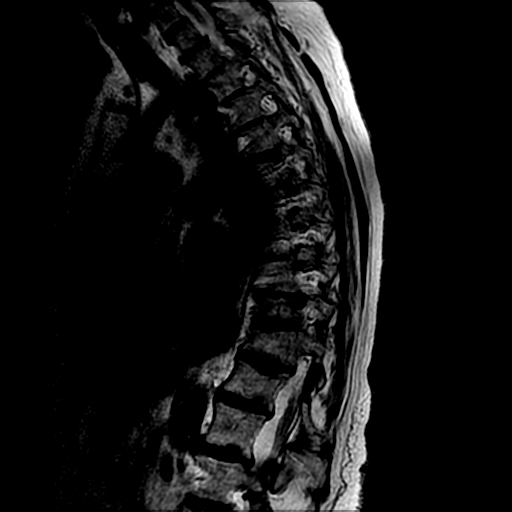
[im 6/14]
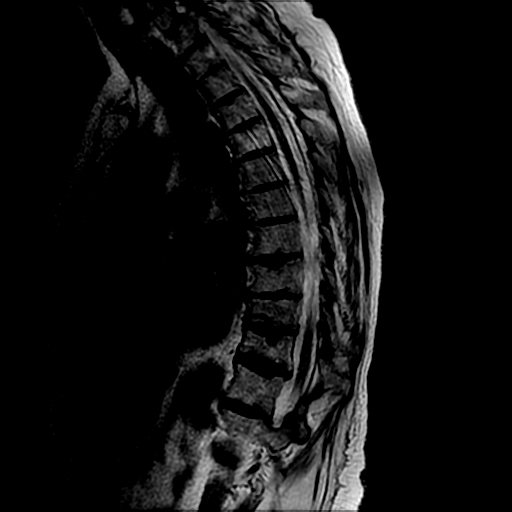
[im 8/14]
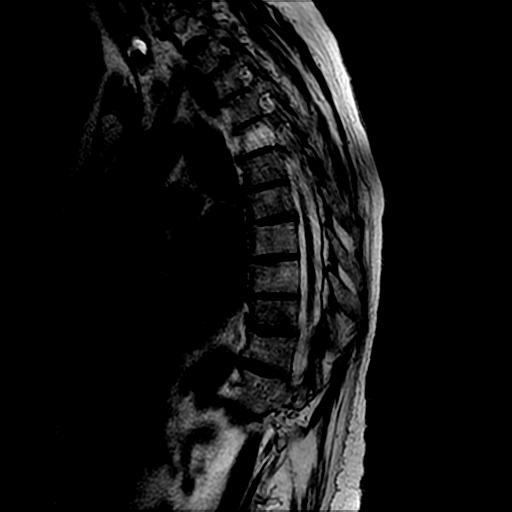
[im 11/14]
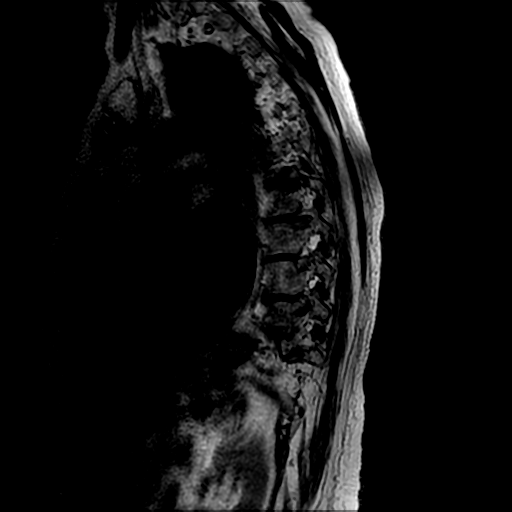
[im 14/14]
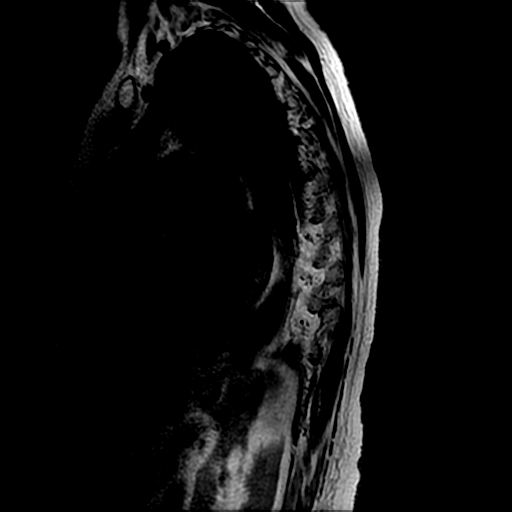

[Series 6: sag ir · sagittal · 3.0mm · 0.62mm/px · 3 of 15 slices shown]
[im 1/15]
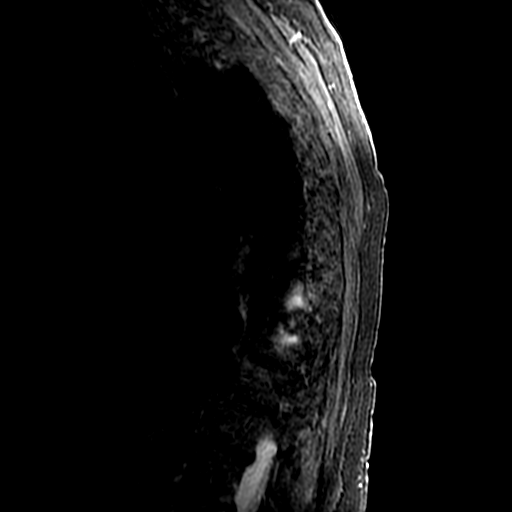
[im 8/15]
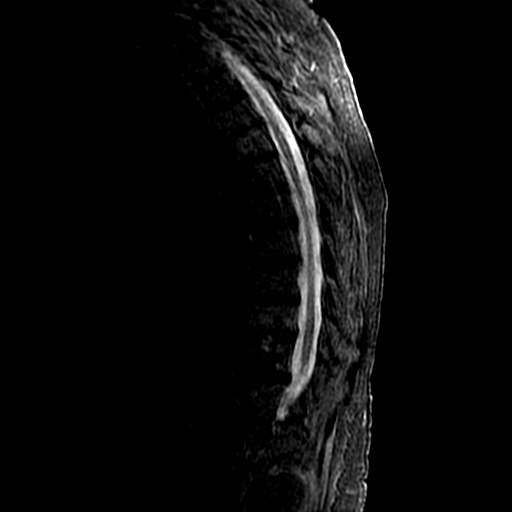
[im 15/15]
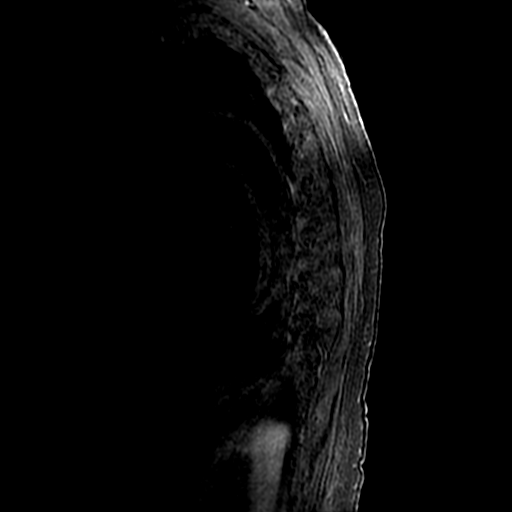

[Series 7: T1 · sagittal · 3.0mm · 0.62mm/px · 3 of 15 slices shown]
[im 1/15]
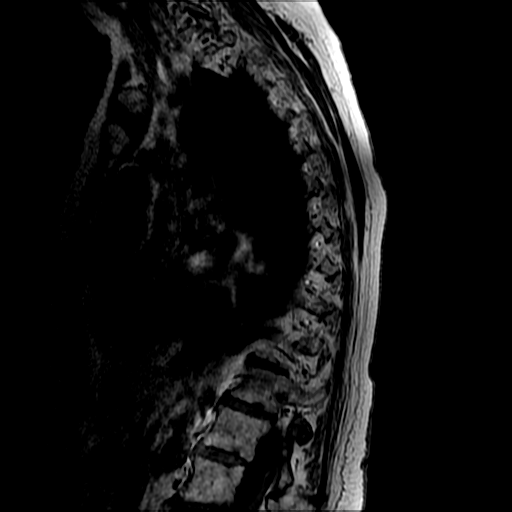
[im 8/15]
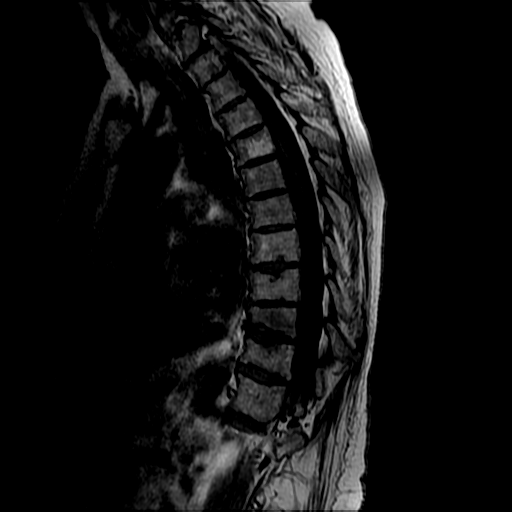
[im 15/15]
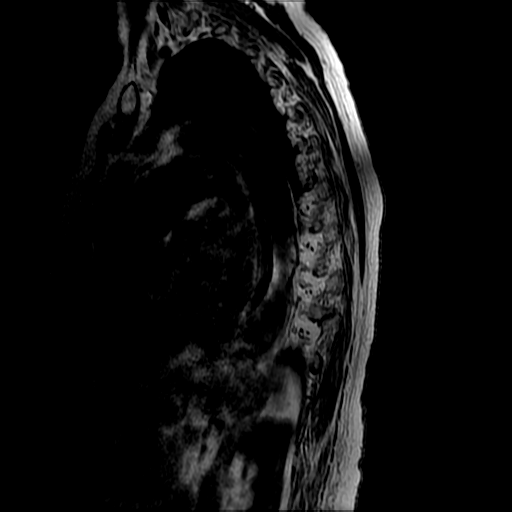

[Series 8: T2 · axial · 4.0mm · 0.43mm/px · z∈[-297,-145]mm · 7 of 45 slices shown (2 of 2)]
[im 3/45]
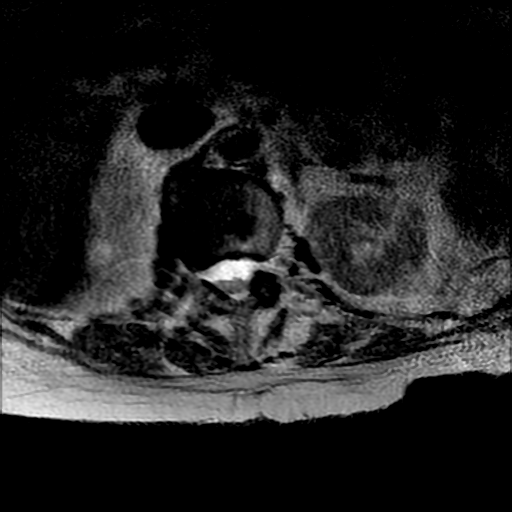
[im 6/45]
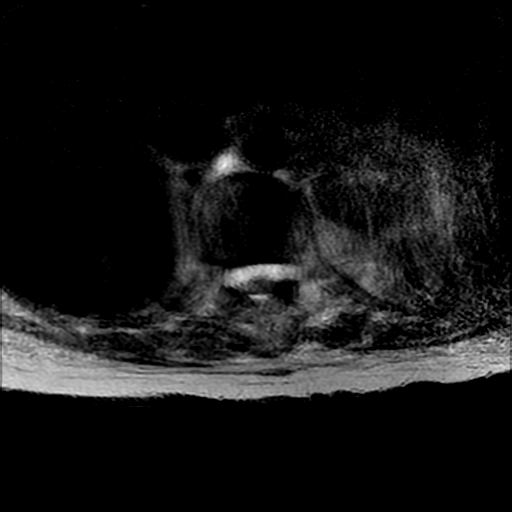
[im 9/45]
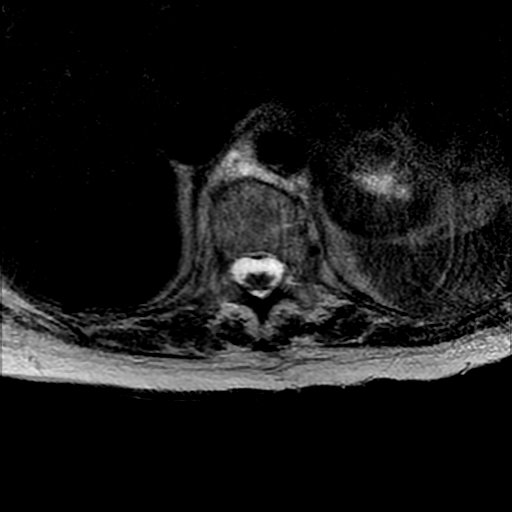
[im 15/45]
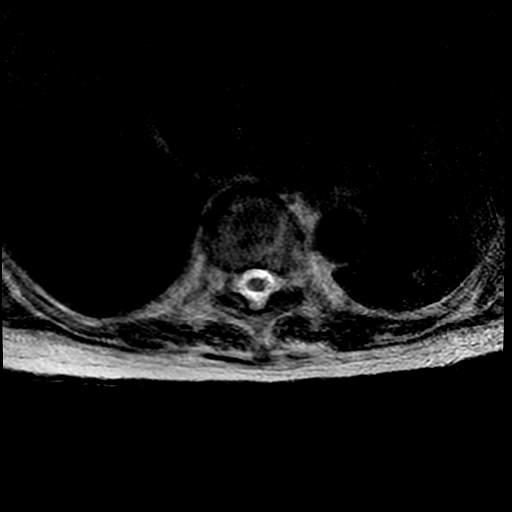
[im 21/45]
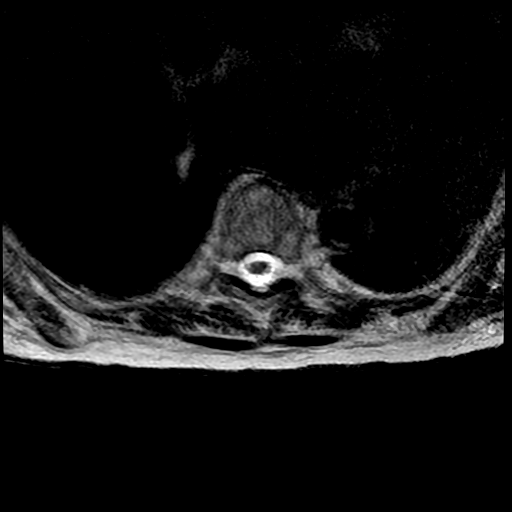
[im 24/45]
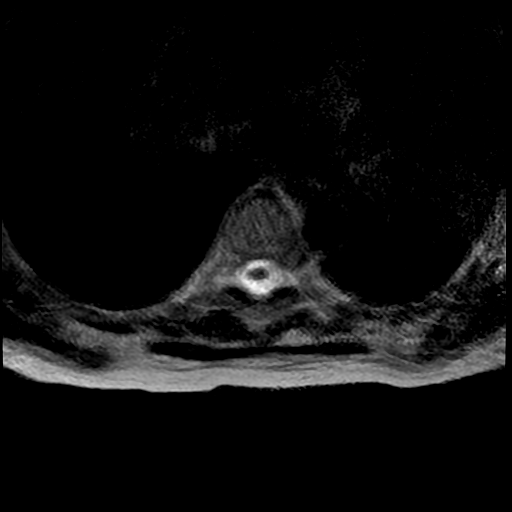
[im 39/45]
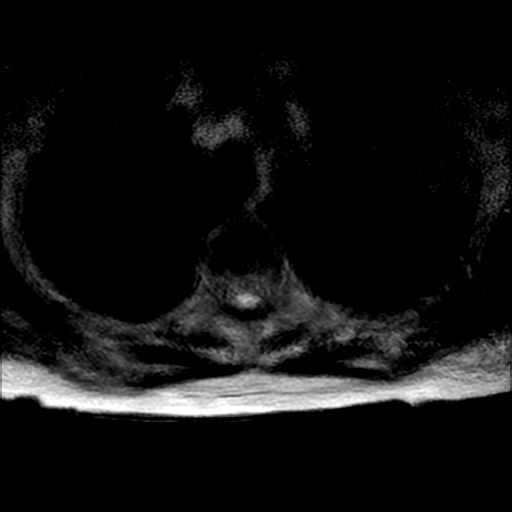

[19 of 48 positions shown; findings below may reference images not displayed]

FINDINGS: Despite efforts by the technologist and patient, motion artifact is
present on today's exam and could not be eliminated. This reduces
exam sensitivity and specificity.

Alignment:  Unchanged mild levoscoliosis.  No significant listhesis.

Vertebrae: Subacute T10 inferior endplate compression fracture with
40% height loss and minimal residual marrow edema, new since
[REDACTED]. Unchanged chronic T5, T9, T11, and T12 compression
deformities. No evidence of discitis or suspicious bone lesion.
Large T5 hemangioma.

Cord:  Normal signal and morphology.

Paraspinal and other soft tissues: Negative.

Disc levels:

Scattered small disc protrusions and mild facet arthropathy. No
spinal canal or neuroforaminal stenosis at any level.
IMPRESSION: 1. Subacute T10 inferior endplate compression fracture, new since
[DATE]. Unchanged chronic T5, T9, T11, and T12 compression deformities.
3. Mild thoracic spondylosis without stenosis or impingement.

## 2021-04-16 IMAGING — MR MR LUMBAR SPINE W/O CM
3 series · 19 of 38 positions shown · non-contrast
Comparison: Radiograph [DATE].

CLINICAL DATA: Compression fracture of L4 vertebra, initial
encounter.

EXAM:
MRI LUMBAR SPINE WITHOUT CONTRAST
TECHNIQUE: Multiplanar, multisequence MR imaging of the lumbar spine was
performed. No intravenous contrast was administered.

[Series 3: T2 · sagittal · 4.0mm · 0.55mm/px · 9 of 12 slices shown]
[im 1/12]
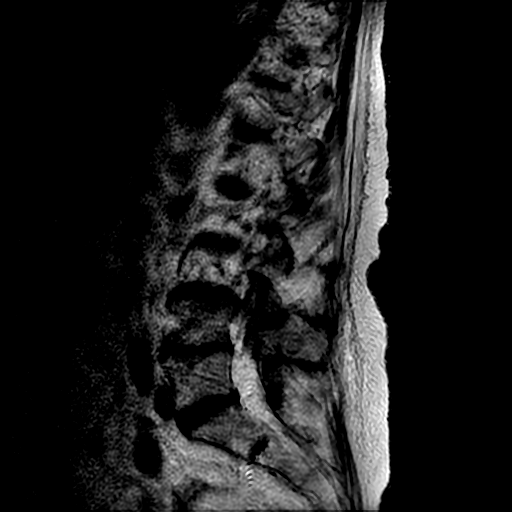
[im 3/12]
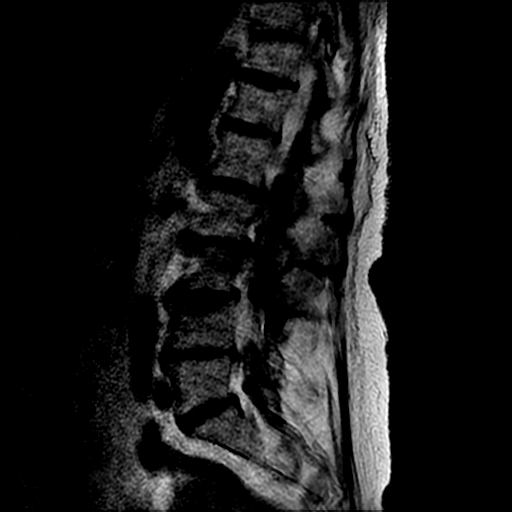
[im 4/12]
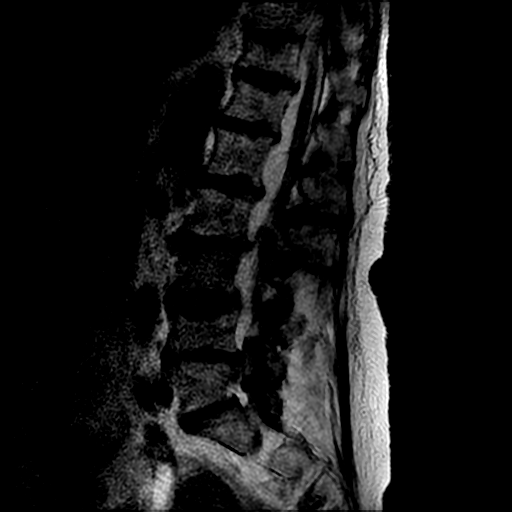
[im 6/12]
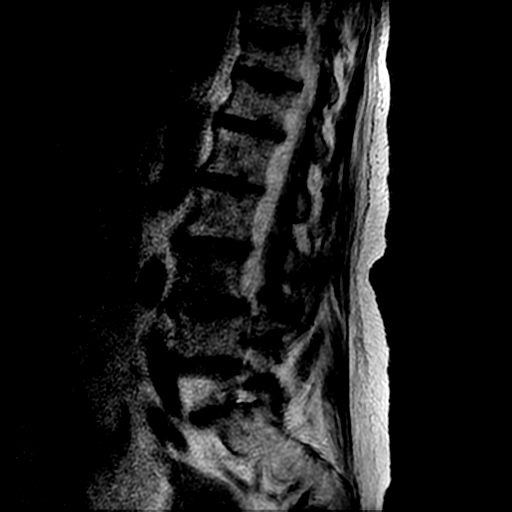
[im 7/12]
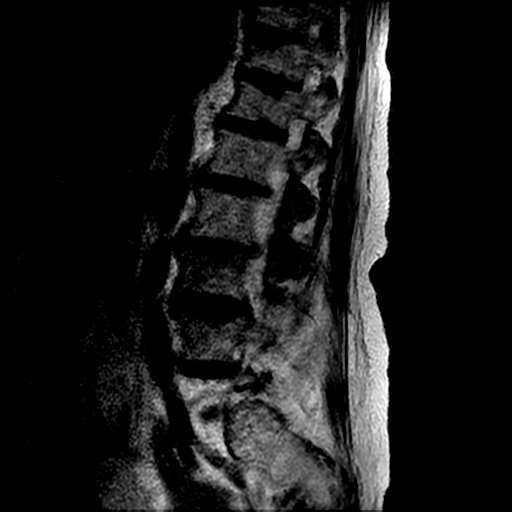
[im 9/12]
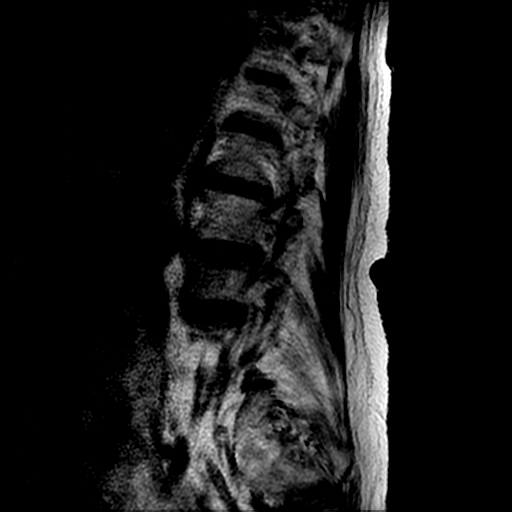
[im 10/12]
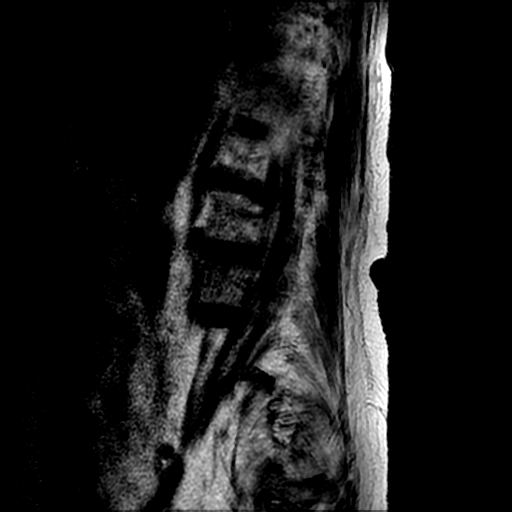
[im 11/12]
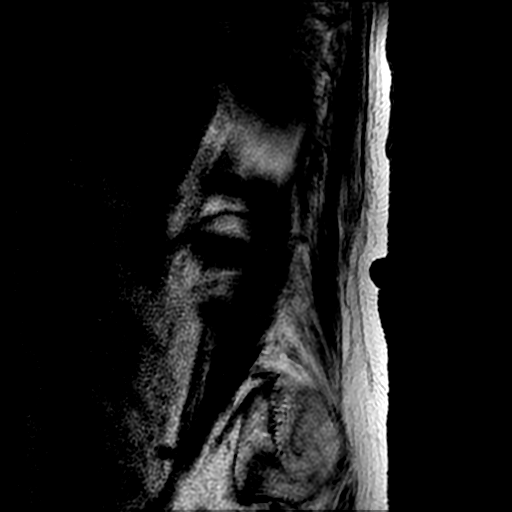
[im 12/12]
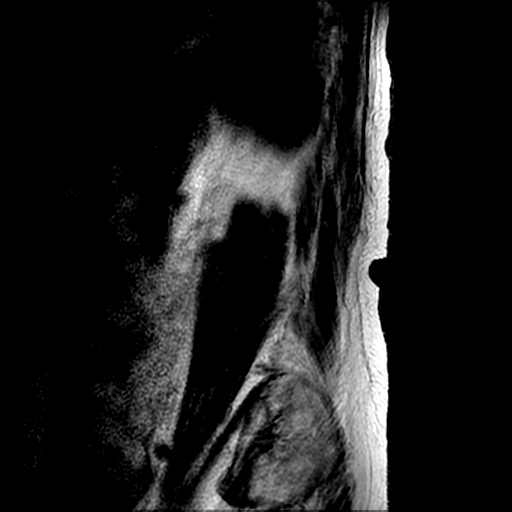

[Series 4: T1 · sagittal · 4.0mm · 0.55mm/px · 7 of 13 slices shown]
[im 1/13]
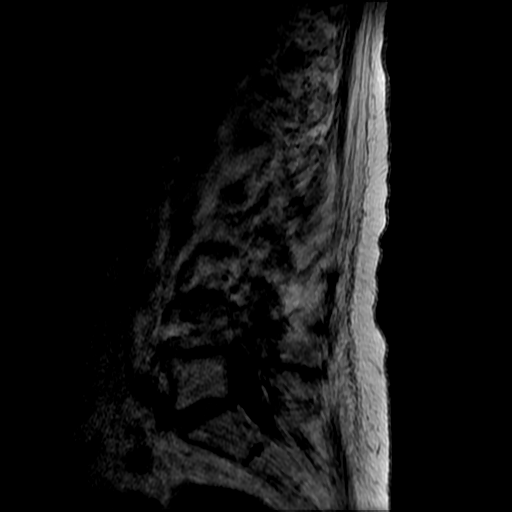
[im 3/13]
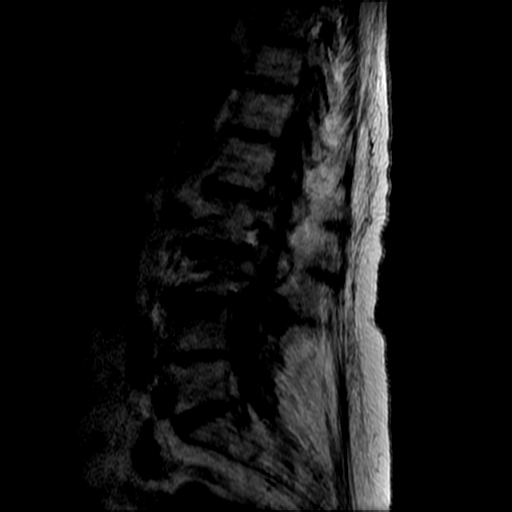
[im 5/13]
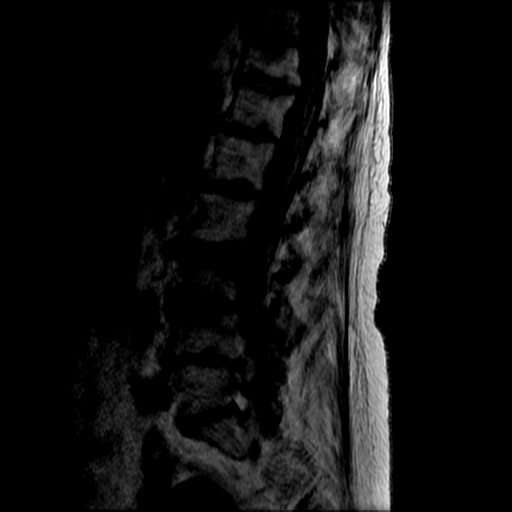
[im 6/13]
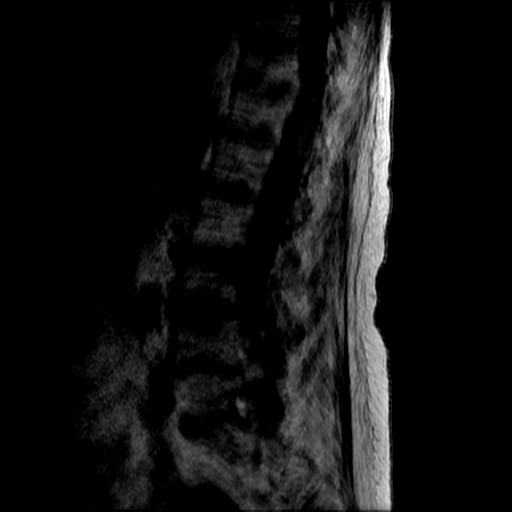
[im 7/13]
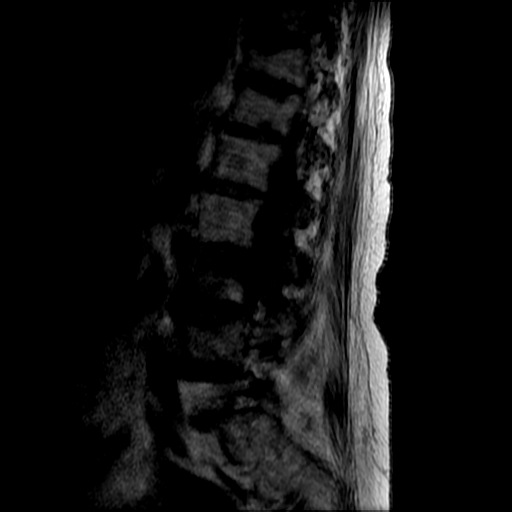
[im 8/13]
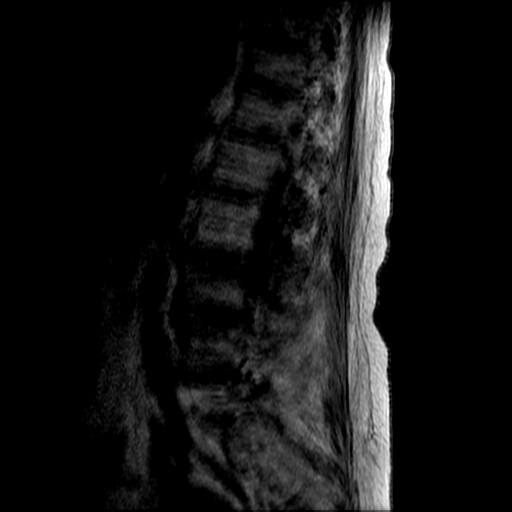
[im 11/13]
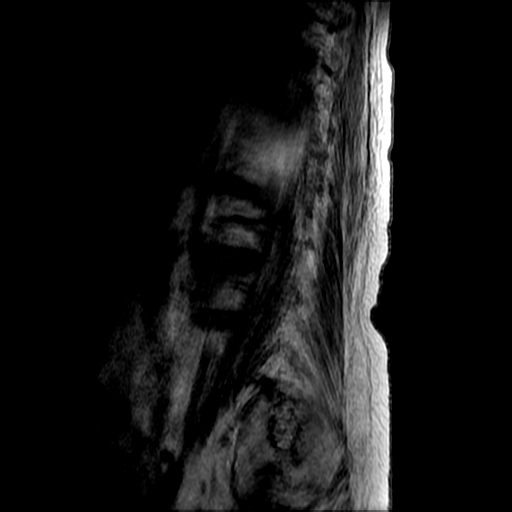

[Series 5: sag ir · sagittal · 4.0mm · 0.55mm/px · 3 of 13 slices shown]
[im 3/13]
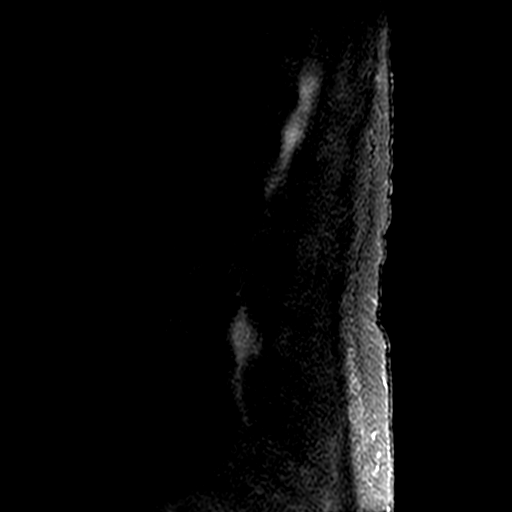
[im 7/13]
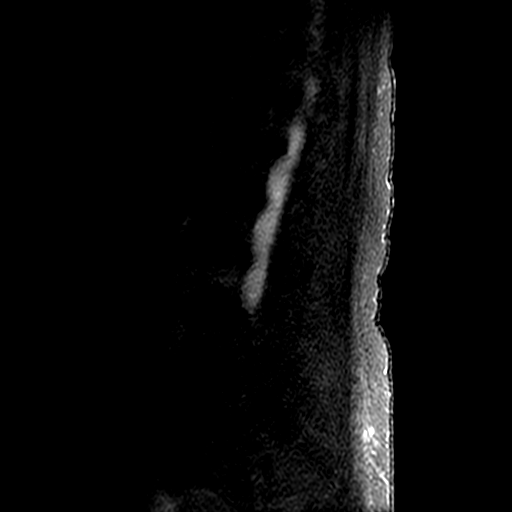
[im 11/13]
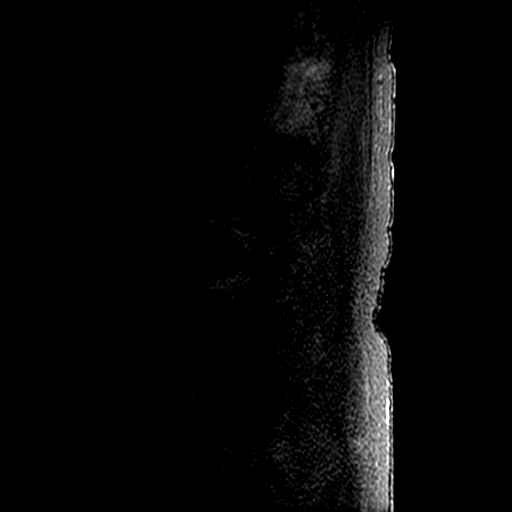

[19 of 38 positions shown; findings below may reference images not displayed]

FINDINGS: The study is incomplete as patient could not tolerate lying still in
the scanner for the duration of the exam. Images obtained are
significantly degraded by motion.

Segmentation: Standard.

Alignment: Dextroconvex scoliosis. Grade 1 anterolisthesis of L3
over L4. Small retrolisthesis of L4 over L5.

Vertebrae: Compression fracture with marrow edema of the L4
vertebral body resulting in approximately 35% loss of vertebral body
height. No significant retropulsion. Mild compression fracture of
the L3 superior endplate is also seen with associated marrow edema
without significant height loss or retropulsion. No evidence of
discitis.

Conus medullaris and cauda equina: Conus extends to the L1 level.
Conus and cauda equina appear grossly unremarkable.

Paraspinal and other soft tissues: Negative.

Disc levels:

On sagittal images, small disc bulges are seen throughout the lumbar
spine with probably some degree of spinal canal stenosis at L4-5. No
high-grade neural foraminal narrowing seen.
IMPRESSION: 1. Incomplete study, significantly degraded by motion. A repeat
study when clinically appropriate is suggested.
2. Compression fractures of the L3 in of L4 superior endplate
without significant retropulsion.

## 2021-04-16 NOTE — Progress Notes (Addendum)
Informed of MRI for today.   Device system confirmed to be MRI conditional, with implant date > 6 weeks ago, and no evidence of abandoned or epicardial leads in review of most recent CXR Interrogation from today reviewed, pt is currently VP at ~65 bpm Change device settings for MRI to VOO at least 15 bpm higher than baseline HR not to exceed 95.  Tachy-therapies to off if applicable.  Program device back to pre-MRI settings after completion of exam.  Luane School  04/16/2021 12:22 PM

## 2021-04-16 NOTE — Progress Notes (Signed)
MRI attempted on pt.  We were able to get the  MRI thoracic spine.  Pt pleasantly confused and  not understanding what we were doing.  We were able to redirect her numerus times.  We were not able to obtain the MRI lumbar at this time.  Pt kept trying to sit up and saying stop.

## 2021-04-16 NOTE — Progress Notes (Signed)
Per order and discussion, Changed device settings for MRI to VOO at least 15 bpm higher than baseline HR not to exceed 95.   VOO 80 bpm.  Tachy-therapies to off if applicable.   Will program device back to pre-MRI settings after completion of exam.

## 2021-04-28 DIAGNOSIS — S32040A Wedge compression fracture of fourth lumbar vertebra, initial encounter for closed fracture: Secondary | ICD-10-CM | POA: Diagnosis not present

## 2021-05-12 ENCOUNTER — Other Ambulatory Visit: Payer: Medicare Other

## 2021-05-12 ENCOUNTER — Other Ambulatory Visit: Payer: Self-pay

## 2021-05-12 DIAGNOSIS — Z515 Encounter for palliative care: Secondary | ICD-10-CM

## 2021-05-14 DIAGNOSIS — R531 Weakness: Secondary | ICD-10-CM | POA: Diagnosis not present

## 2021-05-14 NOTE — Progress Notes (Signed)
°  COMMUNITY PALLIATIVE CARE RN NOTE  PRIMARY CARE PROVIDER: Dr. Jacquiline Doe RESPONSIBLE PARTY:  Raina Mina  PLAN OF CARE and INTERVENTION:  ADVANCE CARE PLANNING/GOALS OF CARE: Goal is to maintain comfort, safety, remain in home and continue dialysis as able. PATIENT/CAREGIVER EDUCATION: Palliative Care services, provided contact information, encouraged to call with questions and concerns. DISEASE STATUS: RN Palliative care telephonic encounter completed. Spoke with patient's wife, Clydie Braun. Patient recently in ED due to a syncopal episode. Patient recently started on midodrine and questioned if this was the cause of patient's syncope. Patient is having difficulty completing his dialysis. He had to leave early last week due to severe anxiety. Clydie Braun shared that over the past 2-3 weeks, patient has been voicing that he is getting tired and knows that he is going to die soon. Clydie Braun voiced support of patient in what he decides. Patient denies any pain currently. Scheduled visit with Palliative NP.    HISTORY OF PRESENT ILLNESS: This is a 88 year old female residing at home with his wife. Diagnosis includes but not be limited to ESRD, frontal lobe CVA, PVD, obstructive hypertrophic cardiomyopathy. Palliative care team to continue to follow to support with goals of care and complex decision making.  CODE STATUS: DNR MOST FORM: No PPS: 50%  PHYSICAL EXAM: Deferred     (Duration of Visit and documentation -40 minutes)       Estanislado Pandy, RN

## 2021-05-15 DIAGNOSIS — R410 Disorientation, unspecified: Secondary | ICD-10-CM | POA: Diagnosis not present

## 2021-05-18 DIAGNOSIS — Z20822 Contact with and (suspected) exposure to covid-19: Secondary | ICD-10-CM | POA: Diagnosis not present

## 2021-05-22 ENCOUNTER — Other Ambulatory Visit: Payer: Self-pay | Admitting: Family Medicine

## 2021-05-22 ENCOUNTER — Other Ambulatory Visit: Payer: Self-pay

## 2021-05-22 ENCOUNTER — Ambulatory Visit
Admission: RE | Admit: 2021-05-22 | Discharge: 2021-05-22 | Disposition: A | Discharge status: Home / Self Care | Payer: Medicare Other | Source: Ambulatory Visit | Attending: Family Medicine | Admitting: Family Medicine

## 2021-05-22 DIAGNOSIS — R609 Edema, unspecified: Secondary | ICD-10-CM | POA: Diagnosis not present

## 2021-05-22 DIAGNOSIS — S0990XA Unspecified injury of head, initial encounter: Secondary | ICD-10-CM | POA: Diagnosis not present

## 2021-05-22 DIAGNOSIS — R296 Repeated falls: Secondary | ICD-10-CM | POA: Diagnosis not present

## 2021-05-22 DIAGNOSIS — I480 Paroxysmal atrial fibrillation: Secondary | ICD-10-CM | POA: Diagnosis not present

## 2021-05-22 DIAGNOSIS — S0093XA Contusion of unspecified part of head, initial encounter: Secondary | ICD-10-CM

## 2021-05-22 DIAGNOSIS — F0394 Unspecified dementia, unspecified severity, with anxiety: Secondary | ICD-10-CM | POA: Diagnosis not present

## 2021-05-22 DIAGNOSIS — D6869 Other thrombophilia: Secondary | ICD-10-CM | POA: Diagnosis not present

## 2021-05-22 DIAGNOSIS — J323 Chronic sphenoidal sinusitis: Secondary | ICD-10-CM | POA: Diagnosis not present

## 2021-05-22 DIAGNOSIS — I10 Essential (primary) hypertension: Secondary | ICD-10-CM | POA: Diagnosis not present

## 2021-05-22 IMAGING — CT CT HEAD W/O CM
1 series · 15 of 30 positions shown, 19 images · non-contrast
Comparison: None.

CLINICAL DATA: Fall, contusion of head.  Dementia.



[Series 3: head w/(date) · axial · 0.39mm/px · z∈[-209,-74]mm · 15 of 31 slices shown, 19 images]
[im 2/31  brain]
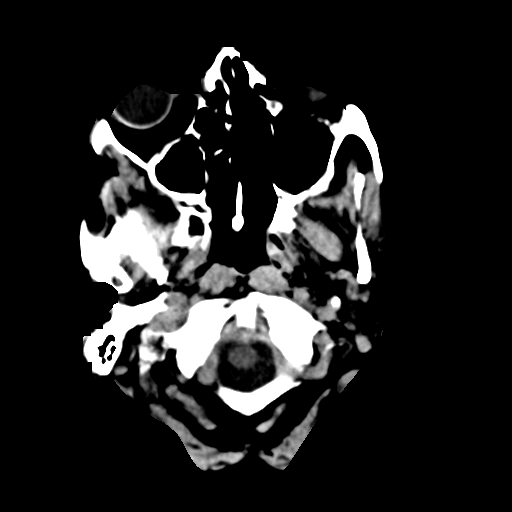
[im 2/31  bone]
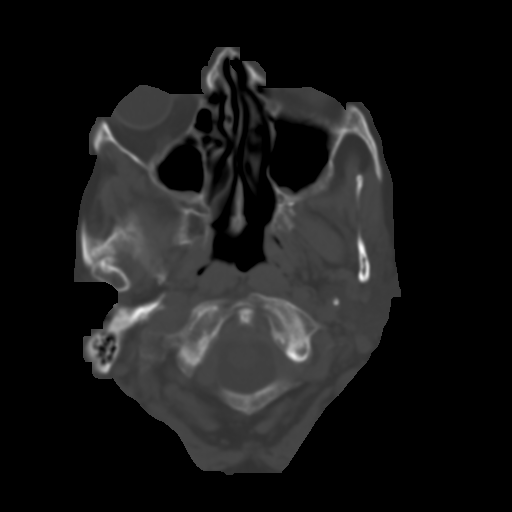
[im 4/31  brain]
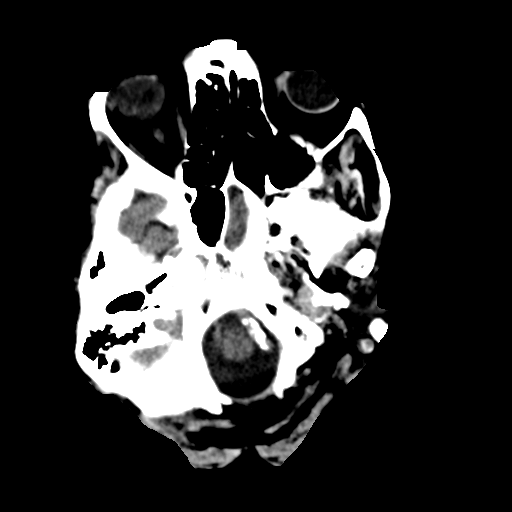
[im 6/31  brain]
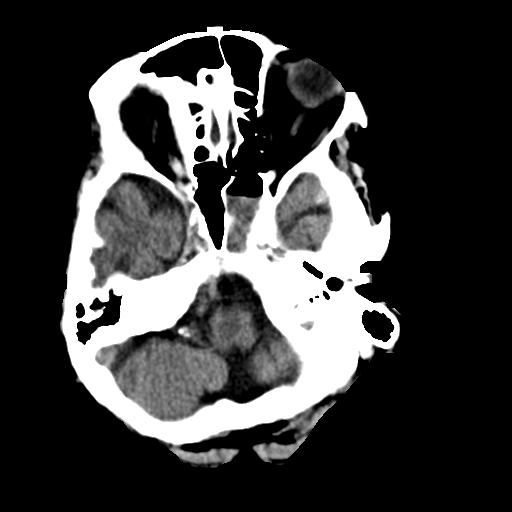
[im 8/31  brain]
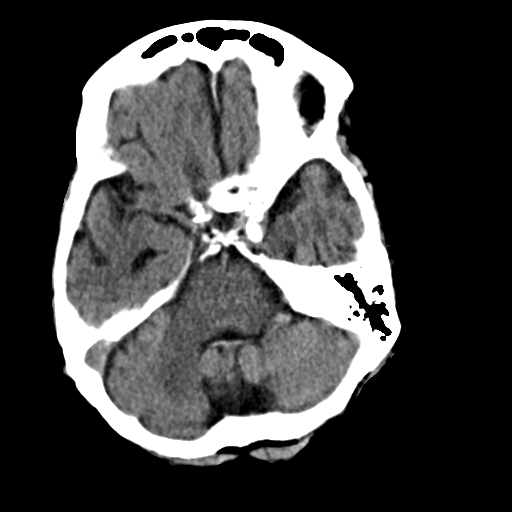
[im 10/31  brain]
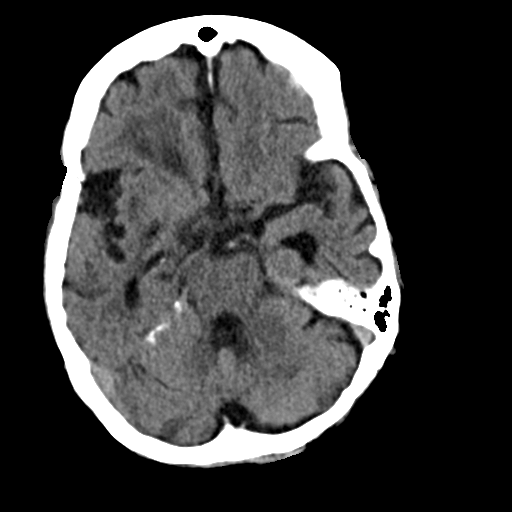
[im 10/31  bone]
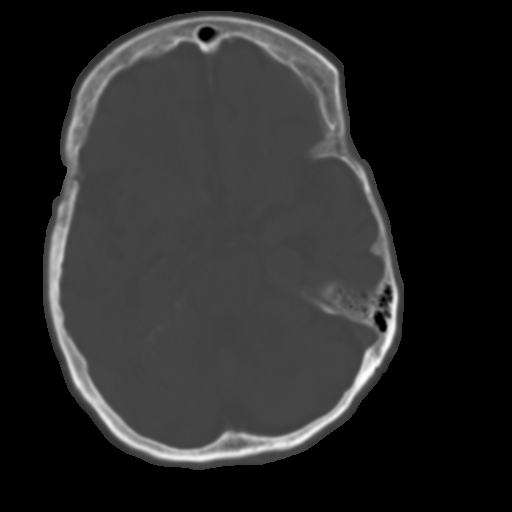
[im 12/31  brain]
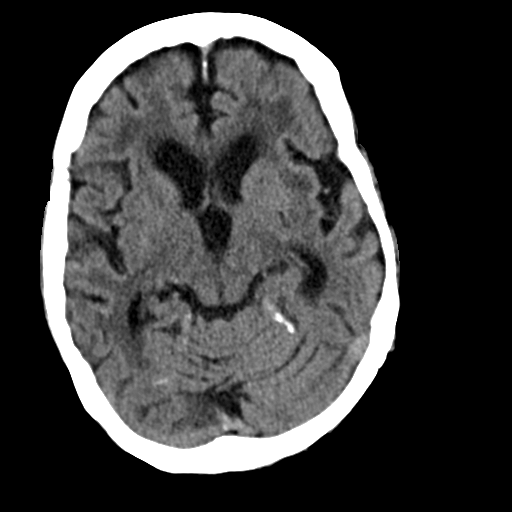
[im 14/31  brain]
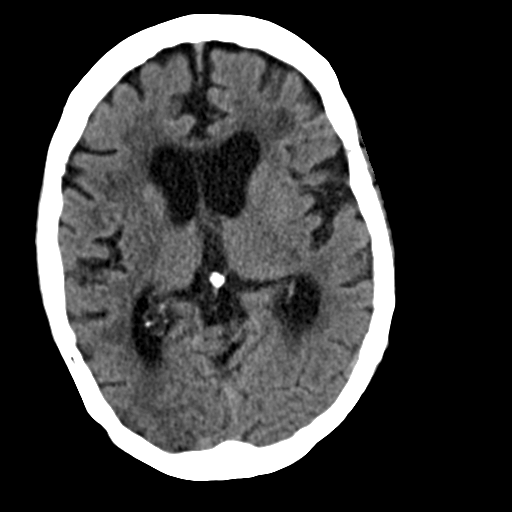
[im 16/31  brain]
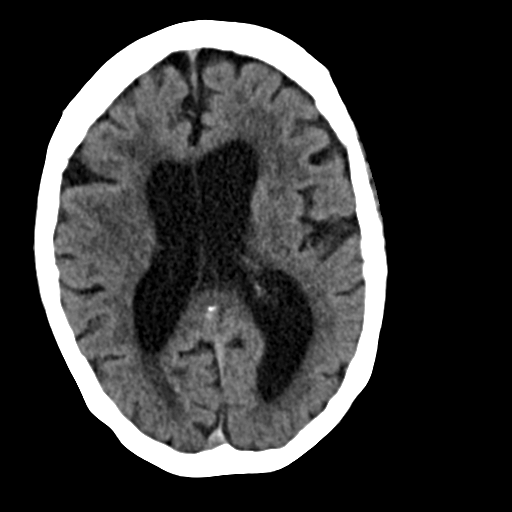
[im 17/31  brain]
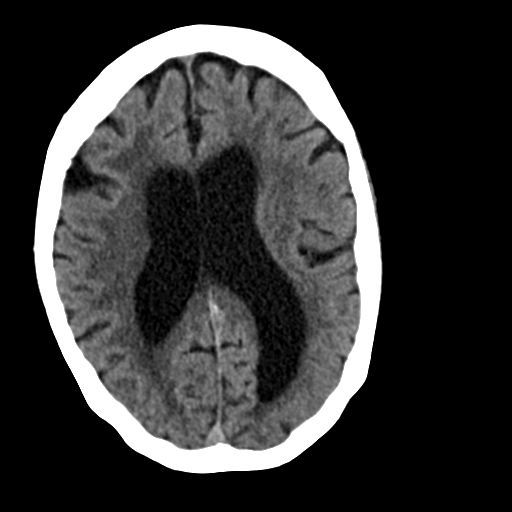
[im 17/31  bone]
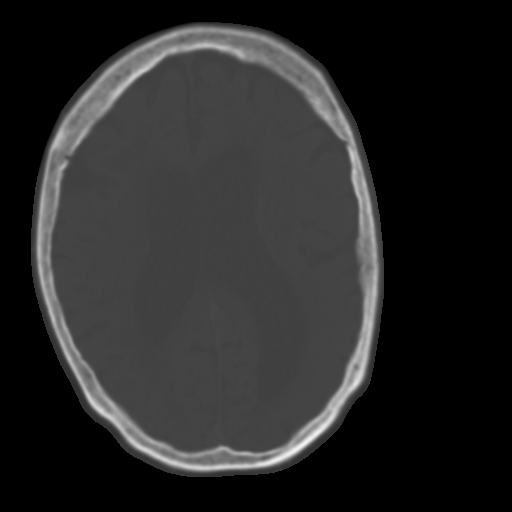
[im 19/31  brain]
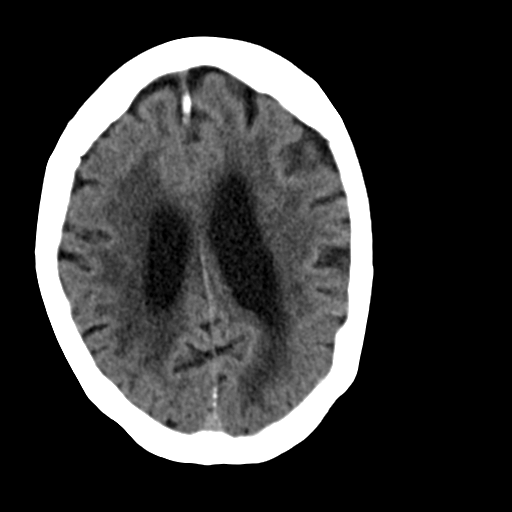
[im 21/31  brain]
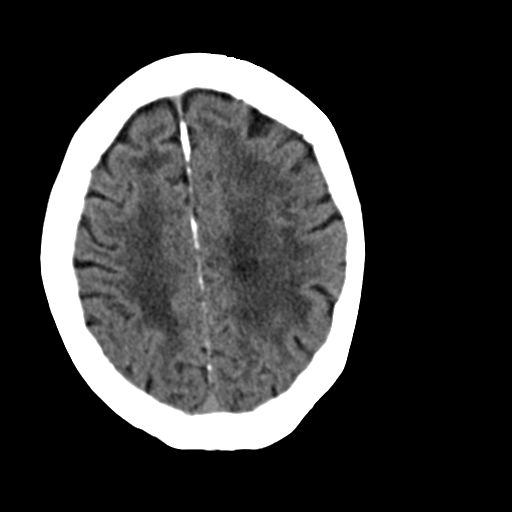
[im 23/31  brain]
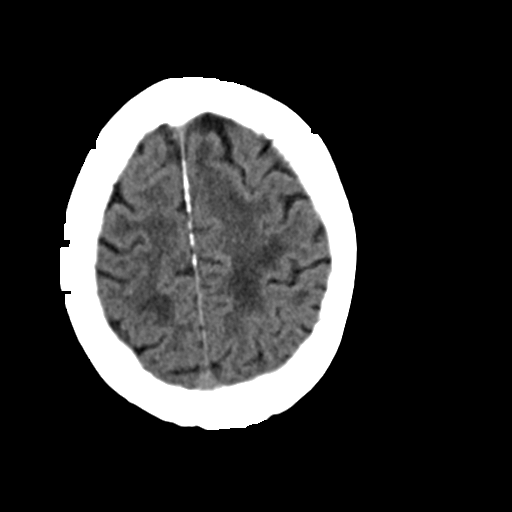
[im 25/31  brain]
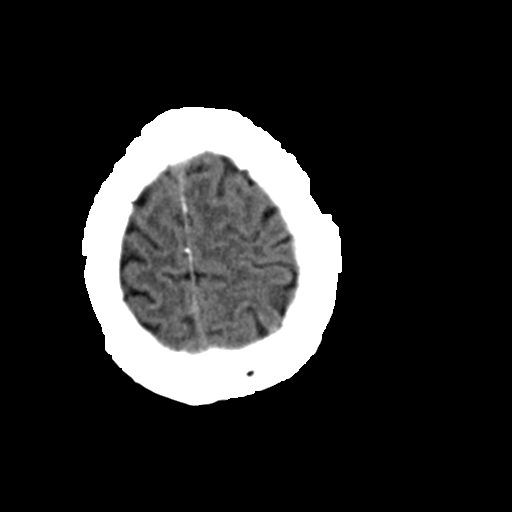
[im 25/31  bone]
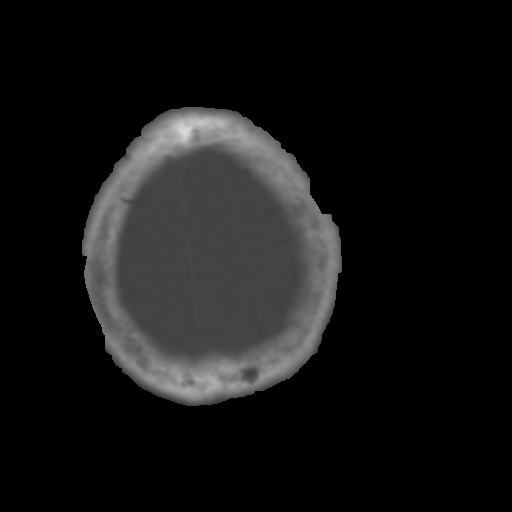
[im 27/31  brain]
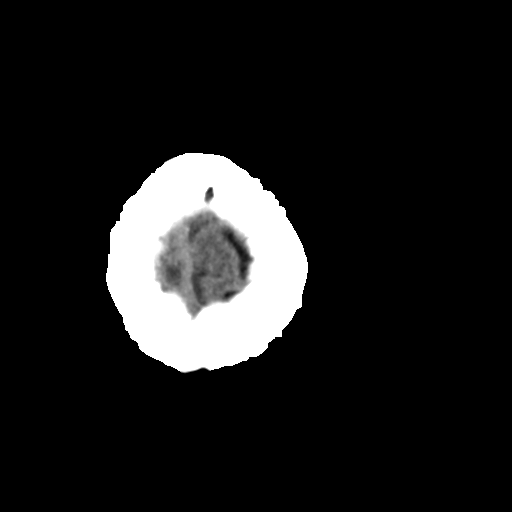
[im 29/31  brain]
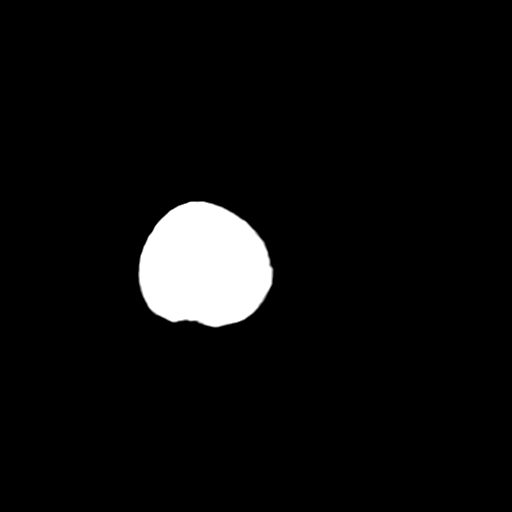

[15 of 30 positions shown; findings below may reference images not displayed]

FINDINGS: Brain: Generalized age related parenchymal volume loss with
commensurate dilatation of the ventricles and sulci. Chronic small
vessel ischemic changes are seen throughout the bilateral
periventricular and subcortical white matter regions.

No mass, hemorrhage, edema or other evidence of acute parenchymal
abnormality is seen. No extra-axial hemorrhage.

Vascular: Chronic calcified atherosclerotic changes of the large
vessels at the skull base. No unexpected hyperdense vessel.

Skull: Normal. Negative for fracture or focal lesion.

Sinuses/Orbits: Fluid/mucosal thickening within the LEFT sphenoid
sinus. Milder mucosal thickening within the maxillary sinuses.
Remainder of the visualized paranasal sinuses are clear. Visualized
periorbital and retro-orbital soft tissues are unremarkable.

Other: None.
IMPRESSION: 1. No acute intracranial abnormality. No intracranial mass,
hemorrhage or edema. No skull fracture.
2. Chronic small vessel ischemic changes in the white matter.
3. Paranasal sinus disease, of uncertain chronicity, most prominent
within the LEFT sphenoid sinus.

## 2021-06-01 DIAGNOSIS — M6281 Muscle weakness (generalized): Secondary | ICD-10-CM | POA: Diagnosis not present

## 2021-06-01 DIAGNOSIS — R278 Other lack of coordination: Secondary | ICD-10-CM | POA: Diagnosis not present

## 2021-06-01 DIAGNOSIS — R2681 Unsteadiness on feet: Secondary | ICD-10-CM | POA: Diagnosis not present

## 2021-06-01 DIAGNOSIS — R296 Repeated falls: Secondary | ICD-10-CM | POA: Diagnosis not present

## 2021-06-03 ENCOUNTER — Other Ambulatory Visit: Payer: Self-pay

## 2021-06-03 ENCOUNTER — Non-Acute Institutional Stay: Payer: Medicare Other | Admitting: Hospice

## 2021-06-03 DIAGNOSIS — F339 Major depressive disorder, recurrent, unspecified: Secondary | ICD-10-CM

## 2021-06-03 DIAGNOSIS — F419 Anxiety disorder, unspecified: Secondary | ICD-10-CM

## 2021-06-03 DIAGNOSIS — F0150 Vascular dementia without behavioral disturbance: Secondary | ICD-10-CM

## 2021-06-03 DIAGNOSIS — R269 Unspecified abnormalities of gait and mobility: Secondary | ICD-10-CM

## 2021-06-03 NOTE — Progress Notes (Signed)
Therapist, nutritional Palliative Care Consult Note Telephone: [phone]  Fax: [phone]  PATIENT NAME: Emma Yang [address] [place] [address] [phone] (home)  DOB: [birth year] MRN: [id number]  PRIMARY CARE PROVIDER:    Laurann Montana, MD,  [address] [place] [address] [phone]  REFERRING PROVIDER:   Laurann Montana, MD [address] [place],  [place] [address] [phone]  RESPONSIBLE PARTY:   Emma Yang Information     Name Relation Home Work Mobile   Emma Yang Daughter [phone]     Emma Yang Daughter   [phone]        I met face to face with patient at facility. Palliative Care was asked to follow this patient by consultation request of  Emma Montana, MD to address advance care planning, complex medical decision making and goals of care clarification. NP called Emma Yang and left her a voicemail with callback number.  This the initial visit.    ASSESSMENT AND / RECOMMENDATIONS: Advance care planning discussions with patient.  She is open to palliative services; communication/cognitive deficits impaired the discussion  CODE STATUS: Patient is a DO NOT RESUSCITATE.  Signed DNR is in facility records  Goals of Care: Goals include to maximize quality of life and symptom management.   I spent  16 minutes providing this initial consultation. More than 50% of the time in this consultation was spent on counseling patient and coordinating communication. --------------------------------------------------------------------------------------------------------------------------------------  Symptom Management/Plan: Dementia: Progressive memory loss/confusion. Impoverished thoughts, impaired judgement, word-finding difficulty. Encourage reminiscence, word search/puzzles, cueing for recollection.  Promote calm approach and engaging environment.  Optimize ongoing supportive  care. Ostheoarthitis: Multiple joints.  Managed with Tylenol Arthritis 650 mg TID prn pain Anxiety/depression: Calm approach, de-escalation.  Continue Zoloft as ordered.  Encourage participation in acute choice activities. Gait disturbance; PT OT is ongoing for strengthening and gait training.  Follow up: Palliative care will continue to follow for complex medical decision making, advance care planning, and clarification of goals. Return 6 weeks or prn. Encouraged to call provider sooner with any concerns.   Family /Caregiver/Community Supports: Patient in [place] for ongoing care  HOSPICE ELIGIBILITY/DIAGNOSIS: TBD  Chief Complaint: Initial Palliative care visit  HISTORY OF PRESENT ILLNESS:  Emma Yang is a 88 y.o. year old female  with multiple medical conditions including vascular dementia with associated progressive memory loss and confusion in line with dementia disease trajectory.  History of depression, anxiety, falls, gait disturbance.  History of depression, anxiety falls.  Patient denies pain/discomfort, limited discussions due to cognitive deficits; nursing staff provided additional history/patient's status.   History obtained from review of EMR, discussion with primary team, caregiver, family and/or Emma Yang.  Review and summarization of Epic records shows history from other than patient. Rest of 10 point ROS asked and negative.  I reviewed as needed, available labs, patient records, imaging, studies and related documents from the EMR.  Physical Exam: Constitutional: NAD General: Well groomed, cooperative EYES: anicteric sclera, lids intact, no discharge  ENMT: Moist mucous membrane CV: S1 S2, RRR, no LE edema Pulmonary: LCTA, no increased work of breathing, no cough, Abdomen: active BS + 4 quadrants, soft and non tender GU: no suprapubic tenderness MSK: weakness, sarcopenia, limited ROM, ambulatory with rolling walker Skin: warm and dry, no rashes or wounds  on visible skin.discoloration and hair loss to bilateral shin Neuro:  weakness, otherwise non focal, memory loss/confusion Psych: non-anxious affect Hem/lymph/immuno: no widespread bruising   PAST  MEDICAL HISTORY:  Active Ambulatory Problems    Diagnosis Date Noted   Vertigo    TIA (transient ischemic attack)    Orthostatic hypotension    Hypothyroid    Hypotension    Hypertension    Falls    Dizziness    Depression    BPPV (benign paroxysmal positional vertigo)    Autonomic disorder    A-fib [facility])    Vascular dementia without behavioral disturbance ([place]) 11/28/2018   Closed fracture of left distal radius 02/03/2021   Resolved Ambulatory Problems    Diagnosis Date Noted   No Resolved Ambulatory Problems   Past Medical History:  Diagnosis Date   Allergic rhinitis    Anemia    Anticoagulant long-term use    Anxiety associated with depression    Atrial flutter ([facility])    Choreoathetosis    Fluency disorder following cerebral infarction    General weakness    GERD (gastroesophageal reflux disease)    Hypomagnesemia    Hyponatremia    Low back pain without sciatica    MDD (major depressive disorder), recurrent episode, moderate ([facility])    Rash    Recurrent cystitis    Recurrent falls    Seborrheic dermatitis    Vasomotor rhinitis     SOCIAL HX:  Social History   Tobacco Use   Smoking status: Former    Types: Cigarettes    Quit date: 04/20/1967    Years since quitting: 54.1   Smokeless tobacco: Never  Substance Use Topics   Alcohol use: Not Currently    Comment: quit 09/2017     FAMILY HX:  Family History  Problem Relation Age of Onset   Hypotension Neg Hx       ALLERGIES:  Allergies  Allergen Reactions   Formaldehyde Itching   Other Other (See Comments)    Tears her skin   Levaquin [Levofloxacin]    Tape       PERTINENT MEDICATIONS:  Outpatient Encounter Medications as of 06/03/2021  Medication Sig   apixaban (ELIQUIS) 2.5 MG TABS tablet Take 2.5  mg by mouth 2 (two) times daily.    carbamide peroxide (GNP EARWAX REMOVAL KIT) 6.5 % OTIC solution 2 drops. Into the affected ear once per week as needed for ear wax impaction.   cephALEXin (KEFLEX) 250 MG capsule Take by mouth 1 day or 1 dose.   Cholecalciferol (VITAMIN D-3 PO) Take by mouth. 2000u by mouth daily   Dentifrices (BIOTENE DRY MOUTH) PSTE Place onto teeth. Provide to use as toothpaste once daily.   dofetilide (TIKOSYN) 250 MCG capsule Take 250 mcg by mouth 2 (two) times daily.   ferrous sulfate 325 (65 FE) MG tablet Take 325 mg by mouth 2 (two) times daily with a meal.    fluticasone (FLONASE) 50 MCG/ACT nasal spray Place 1 spray into both nostrils daily.    ketoconazole (NIZORAL) 2 % shampoo Apply 1 application topically 2 (two) times a week.   levothyroxine (SYNTHROID, LEVOTHROID) 25 MCG tablet Take 25 mcg by mouth daily before breakfast.    losartan (COZAAR) 50 MG tablet Take 50 mg by mouth at bedtime.    meclizine (ANTIVERT) 25 MG tablet Take 25 mg by mouth 2 (two) times daily.   nitrofurantoin, macrocrystal-monohydrate, (MACROBID) 100 MG capsule Take 100 mg by mouth every 12 (twelve) hours. For 7 days.   omeprazole (PRILOSEC) 20 MG capsule Take 20 mg by mouth daily.    oxybutynin (DITROPAN) 5 MG tablet Take 5 mg by  mouth every 6 (six) hours.   UNABLE TO FIND Place 3 drops into both ears 3 (three) times a week. Med Name: GNP Mineral Oil   vitamin B-12 (CYANOCOBALAMIN) 1000 MCG tablet Take 1,000 mcg by mouth daily.    No facility-administered encounter medications on file as of 06/03/2021.    Thank you for the opportunity to participate in the care of Ms. Grandville Silos.  The palliative care team will continue to follow. Please call our office at [phone] if we can be of additional assistance.   Note: Portions of this note were generated with [employer]. Dictation errors may occur despite best attempts at proofreading.  Teodoro Spray, NP

## 2021-06-04 DIAGNOSIS — R278 Other lack of coordination: Secondary | ICD-10-CM | POA: Diagnosis not present

## 2021-06-04 DIAGNOSIS — R2681 Unsteadiness on feet: Secondary | ICD-10-CM | POA: Diagnosis not present

## 2021-06-04 DIAGNOSIS — R296 Repeated falls: Secondary | ICD-10-CM | POA: Diagnosis not present

## 2021-06-04 DIAGNOSIS — M6281 Muscle weakness (generalized): Secondary | ICD-10-CM | POA: Diagnosis not present

## 2021-06-05 DIAGNOSIS — R2681 Unsteadiness on feet: Secondary | ICD-10-CM | POA: Diagnosis not present

## 2021-06-05 DIAGNOSIS — M6281 Muscle weakness (generalized): Secondary | ICD-10-CM | POA: Diagnosis not present

## 2021-06-05 DIAGNOSIS — R296 Repeated falls: Secondary | ICD-10-CM | POA: Diagnosis not present

## 2021-06-05 DIAGNOSIS — R278 Other lack of coordination: Secondary | ICD-10-CM | POA: Diagnosis not present

## 2021-06-09 DIAGNOSIS — R296 Repeated falls: Secondary | ICD-10-CM | POA: Diagnosis not present

## 2021-06-09 DIAGNOSIS — R278 Other lack of coordination: Secondary | ICD-10-CM | POA: Diagnosis not present

## 2021-06-09 DIAGNOSIS — M6281 Muscle weakness (generalized): Secondary | ICD-10-CM | POA: Diagnosis not present

## 2021-06-09 DIAGNOSIS — R2681 Unsteadiness on feet: Secondary | ICD-10-CM | POA: Diagnosis not present

## 2021-06-11 DIAGNOSIS — R2681 Unsteadiness on feet: Secondary | ICD-10-CM | POA: Diagnosis not present

## 2021-06-11 DIAGNOSIS — R278 Other lack of coordination: Secondary | ICD-10-CM | POA: Diagnosis not present

## 2021-06-11 DIAGNOSIS — M6281 Muscle weakness (generalized): Secondary | ICD-10-CM | POA: Diagnosis not present

## 2021-06-11 DIAGNOSIS — R296 Repeated falls: Secondary | ICD-10-CM | POA: Diagnosis not present

## 2021-06-12 DIAGNOSIS — Z8616 Personal history of COVID-19: Secondary | ICD-10-CM | POA: Diagnosis not present

## 2021-06-12 DIAGNOSIS — Z7901 Long term (current) use of anticoagulants: Secondary | ICD-10-CM | POA: Diagnosis not present

## 2021-06-12 DIAGNOSIS — Z950 Presence of cardiac pacemaker: Secondary | ICD-10-CM | POA: Diagnosis not present

## 2021-06-12 DIAGNOSIS — M6281 Muscle weakness (generalized): Secondary | ICD-10-CM | POA: Diagnosis not present

## 2021-06-12 DIAGNOSIS — I4821 Permanent atrial fibrillation: Secondary | ICD-10-CM | POA: Diagnosis not present

## 2021-06-12 DIAGNOSIS — R296 Repeated falls: Secondary | ICD-10-CM | POA: Diagnosis not present

## 2021-06-12 DIAGNOSIS — I69320 Aphasia following cerebral infarction: Secondary | ICD-10-CM | POA: Diagnosis not present

## 2021-06-12 DIAGNOSIS — Z9889 Other specified postprocedural states: Secondary | ICD-10-CM | POA: Diagnosis not present

## 2021-06-12 DIAGNOSIS — R278 Other lack of coordination: Secondary | ICD-10-CM | POA: Diagnosis not present

## 2021-06-12 DIAGNOSIS — I951 Orthostatic hypotension: Secondary | ICD-10-CM | POA: Diagnosis not present

## 2021-06-12 DIAGNOSIS — R2681 Unsteadiness on feet: Secondary | ICD-10-CM | POA: Diagnosis not present

## 2021-06-12 DIAGNOSIS — E785 Hyperlipidemia, unspecified: Secondary | ICD-10-CM | POA: Diagnosis not present

## 2021-06-15 DIAGNOSIS — I499 Cardiac arrhythmia, unspecified: Secondary | ICD-10-CM | POA: Diagnosis not present

## 2021-06-15 DIAGNOSIS — I4821 Permanent atrial fibrillation: Secondary | ICD-10-CM | POA: Diagnosis not present

## 2021-06-15 DIAGNOSIS — R296 Repeated falls: Secondary | ICD-10-CM | POA: Diagnosis not present

## 2021-06-15 DIAGNOSIS — R278 Other lack of coordination: Secondary | ICD-10-CM | POA: Diagnosis not present

## 2021-06-15 DIAGNOSIS — R2681 Unsteadiness on feet: Secondary | ICD-10-CM | POA: Diagnosis not present

## 2021-06-15 DIAGNOSIS — M6281 Muscle weakness (generalized): Secondary | ICD-10-CM | POA: Diagnosis not present

## 2021-06-16 DIAGNOSIS — Z4509 Encounter for adjustment and management of other cardiac device: Secondary | ICD-10-CM | POA: Diagnosis not present

## 2021-06-16 DIAGNOSIS — R55 Syncope and collapse: Secondary | ICD-10-CM | POA: Diagnosis not present

## 2021-06-16 DIAGNOSIS — Z950 Presence of cardiac pacemaker: Secondary | ICD-10-CM | POA: Diagnosis not present

## 2021-06-17 DIAGNOSIS — Z4509 Encounter for adjustment and management of other cardiac device: Secondary | ICD-10-CM | POA: Diagnosis not present

## 2021-06-17 DIAGNOSIS — R55 Syncope and collapse: Secondary | ICD-10-CM | POA: Diagnosis not present

## 2021-06-18 DIAGNOSIS — R278 Other lack of coordination: Secondary | ICD-10-CM | POA: Diagnosis not present

## 2021-06-18 DIAGNOSIS — R2681 Unsteadiness on feet: Secondary | ICD-10-CM | POA: Diagnosis not present

## 2021-06-18 DIAGNOSIS — R296 Repeated falls: Secondary | ICD-10-CM | POA: Diagnosis not present

## 2021-06-18 DIAGNOSIS — M6281 Muscle weakness (generalized): Secondary | ICD-10-CM | POA: Diagnosis not present

## 2021-06-20 ENCOUNTER — Emergency Department (HOSPITAL_COMMUNITY): Payer: Medicare Other

## 2021-06-20 ENCOUNTER — Emergency Department (HOSPITAL_COMMUNITY)
Admission: EM | Admit: 2021-06-20 | Discharge: 2021-06-20 | Disposition: A | Discharge status: Home / Self Care | Payer: Medicare Other | Attending: Emergency Medicine | Admitting: Emergency Medicine

## 2021-06-20 DIAGNOSIS — W1839XA Other fall on same level, initial encounter: Secondary | ICD-10-CM | POA: Diagnosis not present

## 2021-06-20 DIAGNOSIS — M4312 Spondylolisthesis, cervical region: Secondary | ICD-10-CM | POA: Diagnosis not present

## 2021-06-20 DIAGNOSIS — S0990XA Unspecified injury of head, initial encounter: Secondary | ICD-10-CM | POA: Diagnosis not present

## 2021-06-20 DIAGNOSIS — S0003XA Contusion of scalp, initial encounter: Secondary | ICD-10-CM | POA: Diagnosis not present

## 2021-06-20 DIAGNOSIS — S81811A Laceration without foreign body, right lower leg, initial encounter: Secondary | ICD-10-CM | POA: Diagnosis not present

## 2021-06-20 DIAGNOSIS — S0083XA Contusion of other part of head, initial encounter: Secondary | ICD-10-CM | POA: Diagnosis not present

## 2021-06-20 DIAGNOSIS — R6889 Other general symptoms and signs: Secondary | ICD-10-CM | POA: Diagnosis not present

## 2021-06-20 DIAGNOSIS — W19XXXA Unspecified fall, initial encounter: Secondary | ICD-10-CM

## 2021-06-20 DIAGNOSIS — M2578 Osteophyte, vertebrae: Secondary | ICD-10-CM | POA: Diagnosis not present

## 2021-06-20 DIAGNOSIS — R609 Edema, unspecified: Secondary | ICD-10-CM | POA: Diagnosis not present

## 2021-06-20 DIAGNOSIS — S0993XA Unspecified injury of face, initial encounter: Secondary | ICD-10-CM | POA: Diagnosis present

## 2021-06-20 DIAGNOSIS — Y92002 Bathroom of unspecified non-institutional (private) residence single-family (private) house as the place of occurrence of the external cause: Secondary | ICD-10-CM | POA: Diagnosis not present

## 2021-06-20 DIAGNOSIS — R58 Hemorrhage, not elsewhere classified: Secondary | ICD-10-CM | POA: Diagnosis not present

## 2021-06-20 DIAGNOSIS — Z7901 Long term (current) use of anticoagulants: Secondary | ICD-10-CM | POA: Insufficient documentation

## 2021-06-20 DIAGNOSIS — Z743 Need for continuous supervision: Secondary | ICD-10-CM | POA: Diagnosis not present

## 2021-06-20 LAB — BASIC METABOLIC PANEL
Anion gap: 11 (ref 5–15)
BUN: 10 mg/dL (ref 8–23)
CO2: 22 mmol/L (ref 22–32)
Calcium: 9.1 mg/dL (ref 8.9–10.3)
Chloride: 104 mmol/L (ref 98–111)
Creatinine, Ser: 0.79 mg/dL (ref 0.44–1.00)
GFR, Estimated: >60 mL/min (ref >60)
Glucose, Bld: 106 mg/dL — ABNORMAL HIGH (ref 70–99)
Potassium: 3.6 mmol/L (ref 3.5–5.1)
Sodium: 137 mmol/L (ref 135–145)

## 2021-06-20 LAB — SAMPLE TO BLOOD BANK

## 2021-06-20 LAB — CBC
HCT: 44.9 % (ref 36.0–46.0)
Hemoglobin: 15.1 g/dL — ABNORMAL HIGH (ref 12.0–15.0)
MCH: 30.5 pg (ref 26.0–34.0)
MCHC: 33.6 g/dL (ref 30.0–36.0)
MCV: 90.7 fL (ref 80.0–100.0)
Platelets: 295 10*3/uL (ref 150–400)
RBC: 4.95 MIL/uL (ref 3.87–5.11)
RDW: 13.9 % (ref 11.5–15.5)
WBC: 8.9 10*3/uL (ref 4.0–10.5)
nRBC: 0 % (ref 0.0–0.2)

## 2021-06-20 IMAGING — CT CT HEAD W/O CM
4 series · 14 of 47 positions shown, 16 images · non-contrast
Comparison: [DATE]

CLINICAL DATA: Head trauma, unwitnessed fall



[Series 3: head without · axial · non-contrast · 0.45mm/px · z∈[-95,+20]mm · 7 of 31 slices shown, 9 images]
[im 4/31  brain]
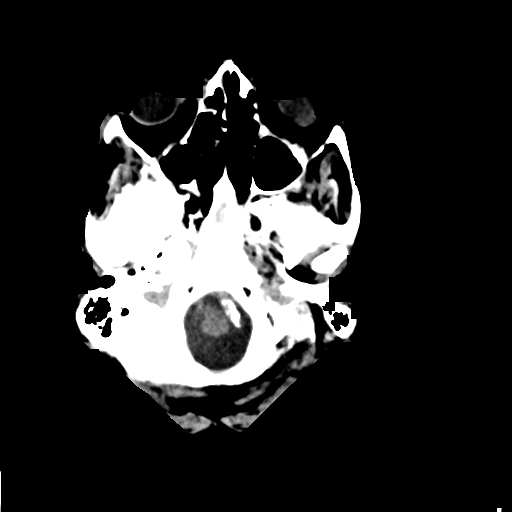
[im 4/31  bone]
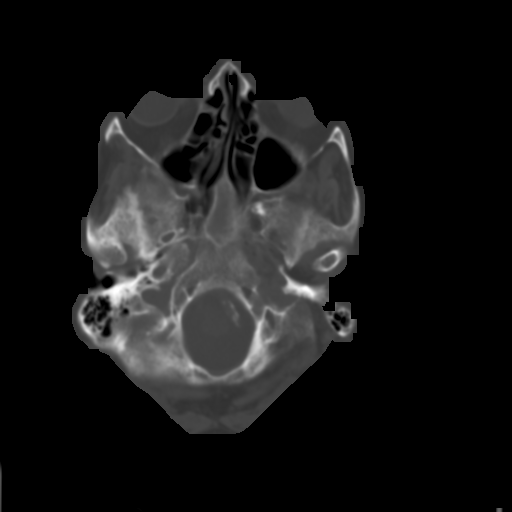
[im 8/31  brain]
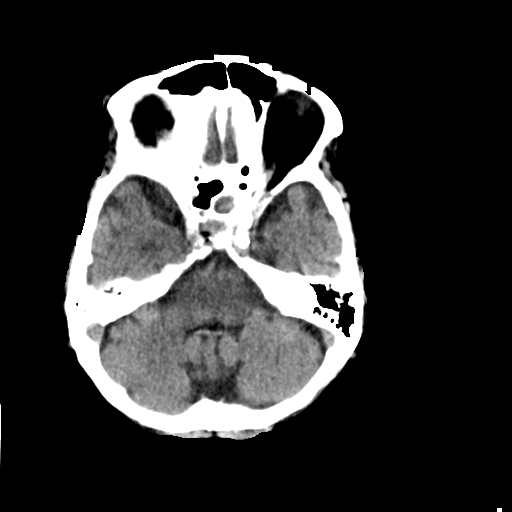
[im 12/31  brain]
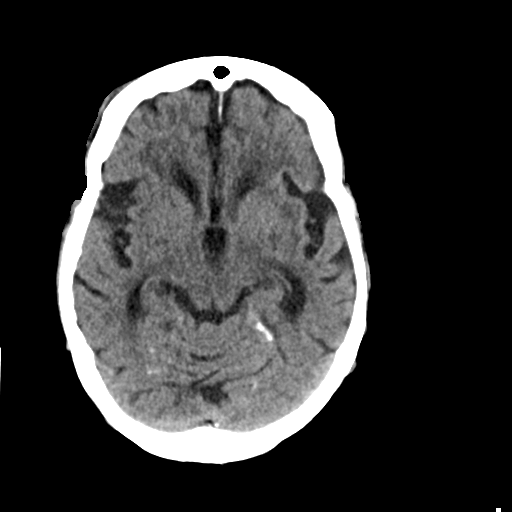
[im 16/31  brain]
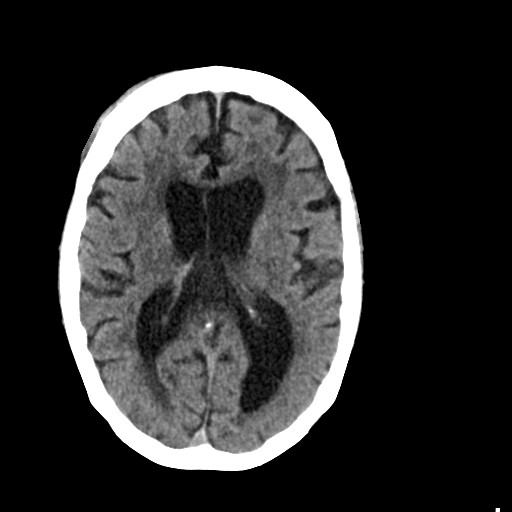
[im 19/31  brain]
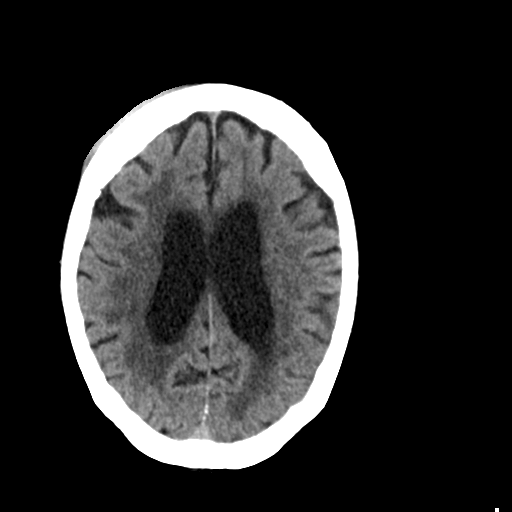
[im 19/31  bone]
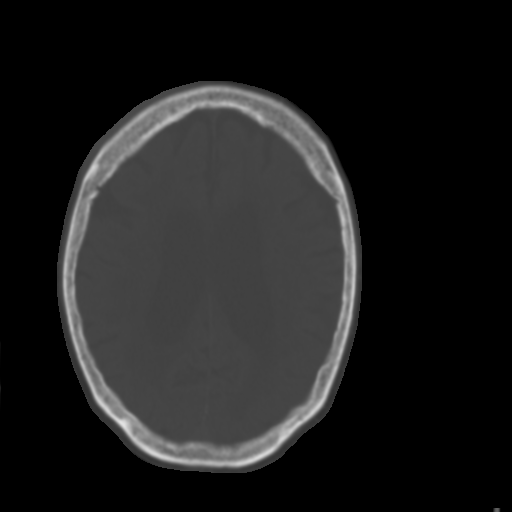
[im 23/31  brain]
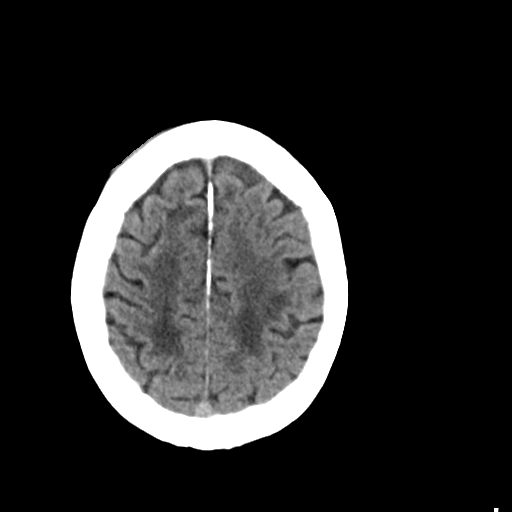
[im 27/31  brain]
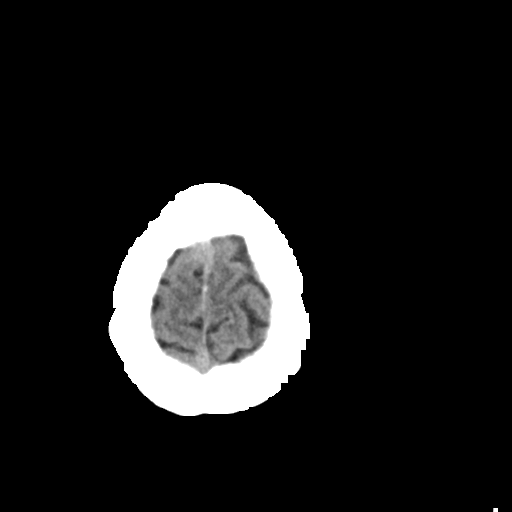

[Series 4: ax head bone · axial · 0.40mm/px · 1 of 78 slices shown]
[im 8/78  bone]
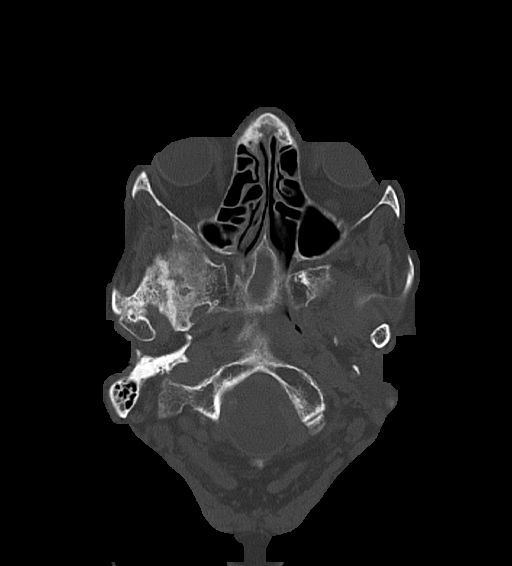

[Series 5: head without cor · coronal · non-contrast · 0.31mm/px · 3 of 65 slices shown]
[im 22/65  brain]
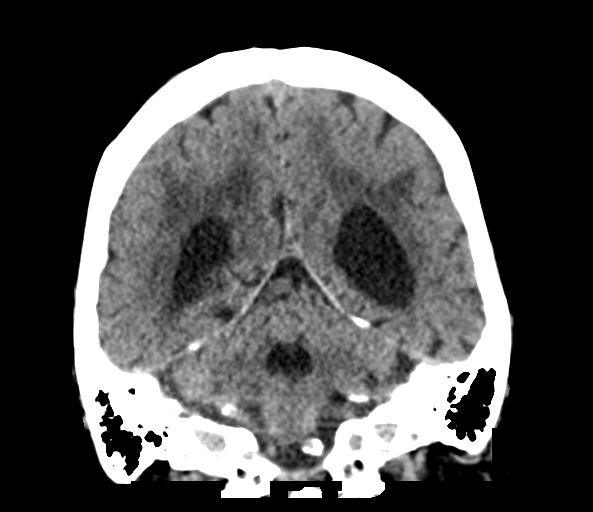
[im 29/65  brain]
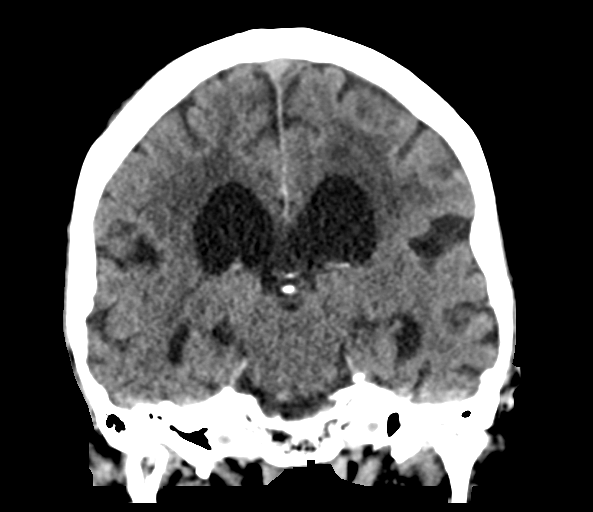
[im 36/65  brain]
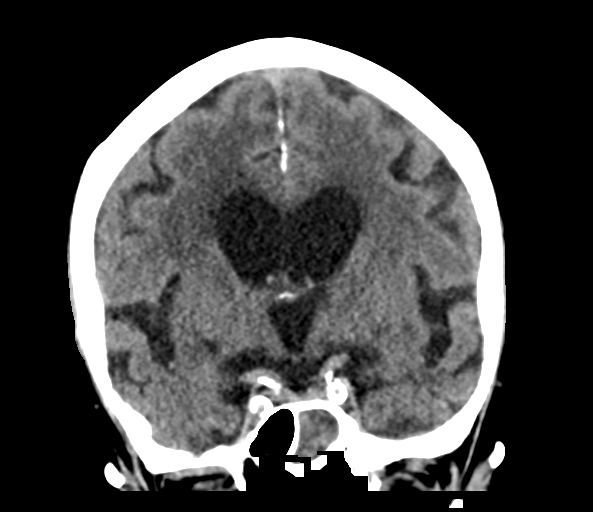

[Series 6: head without sag · sagittal · non-contrast · 0.31mm/px · 3 of 47 slices shown]
[im 16/47  brain]
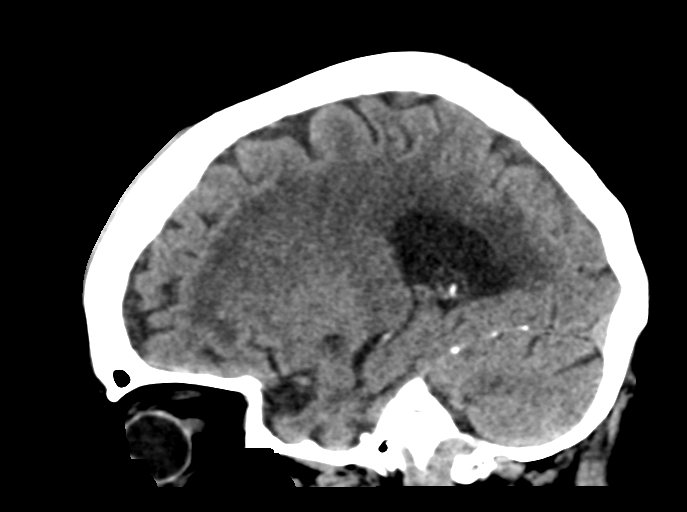
[im 24/47  brain]
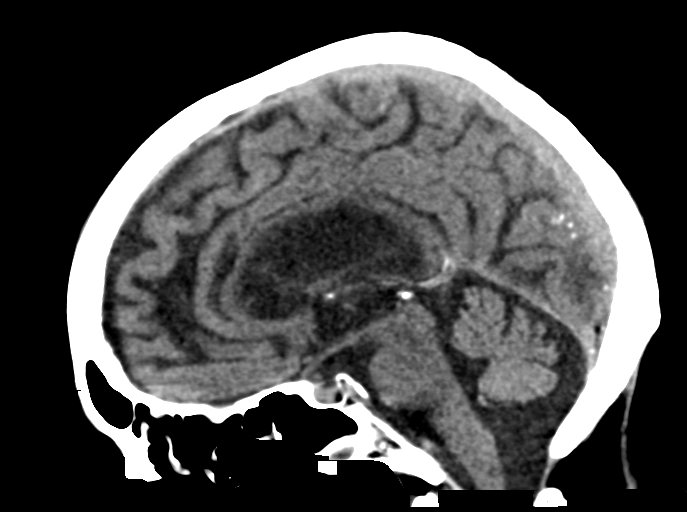
[im 31/47  brain]
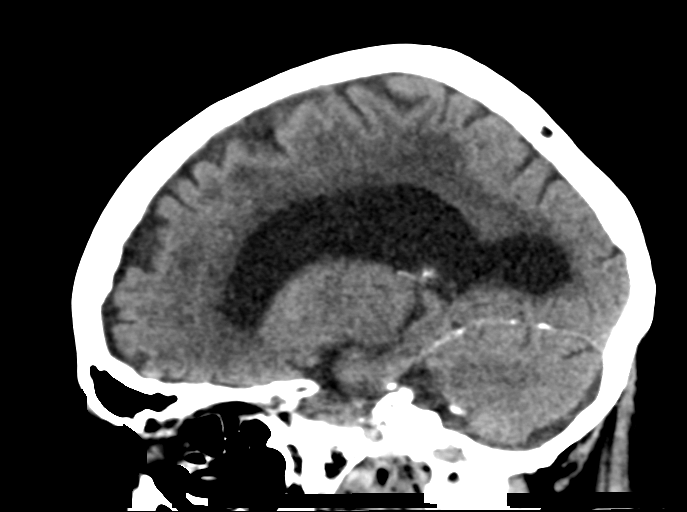

[14 of 47 positions shown; findings below may reference images not displayed]

FINDINGS: Brain: No evidence of acute infarction, hemorrhage, extra-axial
collection, ventriculomegaly, or mass effect. Generalized cerebral
atrophy. Periventricular white matter low attenuation likely
secondary to microangiopathy.

Vascular: Cerebrovascular atherosclerotic calcifications are noted.
No hyperdense vessels.

Skull: Negative for fracture or focal lesion.

Sinuses/Orbits: Visualized portions of the orbits are unremarkable.
Complete opacification of the left sphenoid sinus. Visualized
portions of the mastoid air cells are unremarkable.

Other: Right frontal scalp hematoma.

CT CERVICAL SPINE FINDINGS

Alignment: 2 mm anterolisthesis of C2 on C3 secondary to facet
disease. 2 mm anterolisthesis of C7 on T1 secondary to facet
disease.

Skull base and vertebrae: No acute fracture. No primary bone lesion
or focal pathologic process.

Soft tissues and spinal canal: No prevertebral fluid or swelling. No
visible canal hematoma.

Disc levels: Degenerative disease with disc height loss at C3-4,
C4-5, C5-6, C6-7 and, T1-2 and T2-3. At C2-3 there is moderate left
facet arthropathy. At C3-4 there is bilateral uncovertebral
degenerative changes, mild bilateral facet arthropathy, and no
significant foraminal stenosis. At C4-5 there is a broad-based disc
osteophyte complex, bilateral uncovertebral degenerative changes,
and bilateral mild foraminal stenosis. At C5-6 there is a
broad-based disc osteophyte complex, bilateral uncovertebral
degenerative changes, and left foraminal stenosis. At C6-7 there is
a broad-based disc osteophyte complex, bilateral uncovertebral
degenerative changes and left foraminal stenosis. At C7-T1 there is
moderate left facet arthropathy. At T2-3 there is bilateral facet
arthropathy. Mild

Upper chest: Centrilobular emphysema.

Other: Bilateral carotid artery atherosclerosis.
IMPRESSION: 1. No acute intracranial pathology.
2. Right frontal scalp hematoma. Chronic left sphenoid sinus
disease.
3.  No acute osseous injury of the cervical spine.
4. Cervical spine spondylosis as described above.

## 2021-06-20 NOTE — Progress Notes (Signed)
Orthopedic Tech Progress Note ?Patient Details:  ?Emma Yang ?04-07-1933 ?[id number] ? ? ?Level 2 trauma ? ?Patient ID: Emma Yang, female   DOB: 1932/11/11, 88 y.o.   MRN: [id number] ? ?[name] ?06/20/2021, 5:49 PM ? ?

## 2021-06-20 NOTE — ED Notes (Signed)
RN applied xeroform and gauze dressing to skin tear on R lower leg and R elbow ?

## 2021-06-20 NOTE — ED Triage Notes (Signed)
Pt arrives via [facility] from [facility] after unwitnessed fall in the bathroom after going to lunch. Pt is on eliquis, has bruising and swelling to R eye and R cheek, skin tear to R lower extremity and R elbow. Pt is alert to baseline, hx dementia.   ?

## 2021-06-20 NOTE — ED Notes (Signed)
Pt transported to CT w/ RN 

## 2021-06-20 NOTE — ED Notes (Addendum)
Trauma Response Nurse Documentation ? ? ?Emma Yang is a 88 y.o. female arriving to [facility] ED via EMS ? ?On Eliquis (apixaban) daily. Trauma was activated as a Level 2 based on the following trauma criteria Elderly patients > 65 with head trauma on anti-coagulation (excluding ASA). Trauma team at the bedside on patient arrival. Patient cleared for CT by Dr. Langston Masker. Patient to CT with team. GCS 14. ? ?History  ? Past Medical History:  ?Diagnosis Date  ? A-fib ([place])   ? POST CARDIOVERSION  ? Allergic rhinitis   ? Anemia   ? Anticoagulant long-term use   ? Anxiety associated with depression   ? Atrial flutter ([place])   ? Autonomic disorder   ? ANS  ? BPPV (benign paroxysmal positional vertigo)   ? UNSPECIFIED LATERALITY  ? Choreoathetosis   ? Depression   ? Dizziness   ? Falls   ? Fluency disorder following cerebral infarction   ? General weakness   ? DEBILITY  ? GERD (gastroesophageal reflux disease)   ? Hypertension   ? Hypomagnesemia   ? Hyponatremia   ? Hypothyroid   ? Low back pain without sciatica   ? UNSPECIFIED BACK PAIN LATERALITY, UNSPECIFIED CHRONICITY  ? MDD (major depressive disorder), recurrent episode, moderate ([place])   ? Orthostatic hypotension   ? Rash   ? Recurrent cystitis   ? Recurrent falls   ? Seborrheic dermatitis   ? TIA (transient ischemic attack)   ? Vasomotor rhinitis   ? Vertigo   ?  ? Past Surgical History:  ?Procedure Laterality Date  ? ABLATION    ? cardiac  ? CARDIOVERSION  2018  ? KNEE SURGERY    ? WRIST SURGERY    ?  ? ? ? ?Initial Focused Assessment (If applicable, or please see trauma documentation): ?Patient from memory care unit, found down in bathroom after lunch. Abrasion and bruising over right eye. No other injuries identified. Patient arrives with C-collar in place. ? ?CT's Completed:   ?CT Head and CT C-Spine  ? ?Interventions:  ?IV, labs ?CT Head/Cspine - Right frontal scalp hematoma, no other injury. ? ?Bedside handoff with ED RN Mykenzie.   ? ?Emma Yang   ?Trauma Response RN ? ?Please call TRN at [phone] for further assistance. ?  ?

## 2021-06-20 NOTE — ED Provider Notes (Signed)
[facility] EMERGENCY DEPARTMENT Provider Note   CSN: [id number] Arrival date & time: 06/20/21  1527     History  Chief Complaint  Patient presents with   Lytle Michaels    Emma Yang is a 88 y.o. female presenting from memory care facility with a reported fall today.  Patient is on Eliquis.  She had an unwitnessed fall in her bathroom.  The patient ports he typically walks with a walker but she has had frequent falls per her report and per paramedic report.  She says it is because her legs give out on her.  She denies any active chest pain or shortness of breath.  She has visible bruising to the right side of her face and also has a skin tear to the right lower leg which was dressed by the paramedics.  HPI     Home Medications Prior to Admission medications   Medication Sig Start Date End Date Taking? Authorizing Provider  apixaban (ELIQUIS) 2.5 MG TABS tablet Take 2.5 mg by mouth 2 (two) times daily.     [provider]  carbamide peroxide (GNP EARWAX REMOVAL KIT) 6.5 % OTIC solution 2 drops. Into the affected ear once per week as needed for ear wax impaction.    [provider]  cephALEXin (KEFLEX) 250 MG capsule Take by mouth 1 day or 1 dose.    [provider]  Cholecalciferol (VITAMIN D-3 PO) Take by mouth. 2000u by mouth daily    [provider]  Dentifrices (BIOTENE DRY MOUTH) PSTE Place onto teeth. Provide to use as toothpaste once daily.    [provider]  dofetilide (TIKOSYN) 250 MCG capsule Take 250 mcg by mouth 2 (two) times daily.    [provider]  ferrous sulfate 325 (65 FE) MG tablet Take 325 mg by mouth 2 (two) times daily with a meal.     [provider]  fluticasone (FLONASE) 50 MCG/ACT nasal spray Place 1 spray into both nostrils daily.  06/02/18   [provider]  ketoconazole (NIZORAL) 2 % shampoo Apply 1 application topically 2 (two) times a week.    [provider]   levothyroxine (SYNTHROID, LEVOTHROID) 25 MCG tablet Take 25 mcg by mouth daily before breakfast.  07/17/18   [provider]  losartan (COZAAR) 50 MG tablet Take 50 mg by mouth at bedtime.  01/16/18   [provider]  meclizine (ANTIVERT) 25 MG tablet Take 25 mg by mouth 2 (two) times daily.    [provider]  nitrofurantoin, macrocrystal-monohydrate, (MACROBID) 100 MG capsule Take 100 mg by mouth every 12 (twelve) hours. For 7 days.    [provider]  omeprazole (PRILOSEC) 20 MG capsule Take 20 mg by mouth daily.     [provider]  oxybutynin (DITROPAN) 5 MG tablet Take 5 mg by mouth every 6 (six) hours.    Welford Roche, NP  UNABLE TO FIND Place 3 drops into both ears 3 (three) times a week. Med Name: [facility]    [provider]  vitamin B-12 (CYANOCOBALAMIN) 1000 MCG tablet Take 1,000 mcg by mouth daily.     [provider]      Allergies    Formaldehyde, Other, Levaquin [levofloxacin], and Tape    Review of Systems   Review of Systems  Physical Exam Updated Vital Signs BP (!) 158/96    Pulse 68    Temp 99.3 F (37.4 C) (Oral)    Resp  16    Ht $R'5\' 6"'VF$  (1.676 m)    Wt 62 kg    SpO2 95%    BMI 22.06 kg/m  Physical Exam Constitutional:      General: She is not in acute distress. HENT:     Head: Normocephalic.     Comments: Right periorbital ecchymoses Eyes:     Conjunctiva/sclera: Conjunctivae normal.     Pupils: Pupils are equal, round, and reactive to light.  Neck:     Comments: C-spine collar applied Cardiovascular:     Rate and Rhythm: Normal rate and regular rhythm.  Pulmonary:     Effort: Pulmonary effort is normal. No respiratory distress.  Abdominal:     General: There is no distension.     Tenderness: There is no abdominal tenderness.  Musculoskeletal:     Comments: Full range of motion of the extremities including the hips and the shoulders, no pelvic pain or instability  Skin:    General: Skin  is warm and dry.     Comments: Gaping 4 mm laceration to right lower leg, with subcutaneous fat visible, skin is thin and friable, no active bleeding, no visible underlying muscle tendon Very superficial skin tear to the right forearm  Neurological:     General: No focal deficit present.     Mental Status: She is alert. Mental status is at baseline.    ED Results / Procedures / Treatments   Labs (all labs ordered are listed, but only abnormal results are displayed) Labs Reviewed  CBC - Abnormal; Notable for the following components:      Result Value   Hemoglobin 15.1 (*)    All other components within normal limits  BASIC METABOLIC PANEL - Abnormal; Notable for the following components:   Glucose, Bld 106 (*)    All other components within normal limits  URINALYSIS, ROUTINE W REFLEX MICROSCOPIC  SAMPLE TO BLOOD BANK    EKG None  Radiology CT HEAD WO CONTRAST  Result Date: 06/20/2021 CLINICAL DATA:  Head trauma, unwitnessed fall EXAM: CT HEAD WITHOUT CONTRAST CT CERVICAL SPINE WITHOUT CONTRAST TECHNIQUE: Multidetector CT imaging of the head and cervical spine was performed following the standard protocol without intravenous contrast. Multiplanar CT image reconstructions of the cervical spine were also generated. RADIATION DOSE REDUCTION: This exam was performed according to the departmental dose-optimization program which includes automated exposure control, adjustment of the mA and/or kV according to patient size and/or use of iterative reconstruction technique. COMPARISON:  05/22/2021 FINDINGS: Brain: No evidence of acute infarction, hemorrhage, extra-axial collection, ventriculomegaly, or mass effect. Generalized cerebral atrophy. Periventricular white matter low attenuation likely secondary to microangiopathy. Vascular: Cerebrovascular atherosclerotic calcifications are noted. No hyperdense vessels. Skull: Negative for fracture or focal lesion. Sinuses/Orbits: Visualized portions of  the orbits are unremarkable. Complete opacification of the left sphenoid sinus. Visualized portions of the mastoid air cells are unremarkable. Other: Right frontal scalp hematoma. CT CERVICAL SPINE FINDINGS Alignment: 2 mm anterolisthesis of C2 on C3 secondary to facet disease. 2 mm anterolisthesis of C7 on T1 secondary to facet disease. Skull base and vertebrae: No acute fracture. No primary bone lesion or focal pathologic process. Soft tissues and spinal canal: No prevertebral fluid or swelling. No visible canal hematoma. Disc levels: Degenerative disease with disc height loss at C3-4, C4-5, C5-6, C6-7 and, T1-2 and T2-3. At C2-3 there is moderate left facet arthropathy. At C3-4 there is bilateral uncovertebral degenerative changes, mild bilateral facet arthropathy, and no significant foraminal stenosis. At C4-5 there  is a broad-based disc osteophyte complex, bilateral uncovertebral degenerative changes, and bilateral mild foraminal stenosis. At C5-6 there is a broad-based disc osteophyte complex, bilateral uncovertebral degenerative changes, and left foraminal stenosis. At C6-7 there is a broad-based disc osteophyte complex, bilateral uncovertebral degenerative changes and left foraminal stenosis. At C7-T1 there is moderate left facet arthropathy. At T2-3 there is bilateral facet arthropathy. Mild Upper chest: Centrilobular emphysema. Other: Bilateral carotid artery atherosclerosis. IMPRESSION: 1. No acute intracranial pathology. 2. Right frontal scalp hematoma. Chronic left sphenoid sinus disease. 3.  No acute osseous injury of the cervical spine. 4. Cervical spine spondylosis as described above. Electronically Signed   By: Kathreen Devoid M.D.   On: 06/20/2021 16:08   CT CERVICAL SPINE WO CONTRAST  Result Date: 06/20/2021 CLINICAL DATA:  Head trauma, unwitnessed fall EXAM: CT HEAD WITHOUT CONTRAST CT CERVICAL SPINE WITHOUT CONTRAST TECHNIQUE: Multidetector CT imaging of the head and cervical spine was performed  following the standard protocol without intravenous contrast. Multiplanar CT image reconstructions of the cervical spine were also generated. RADIATION DOSE REDUCTION: This exam was performed according to the departmental dose-optimization program which includes automated exposure control, adjustment of the mA and/or kV according to patient size and/or use of iterative reconstruction technique. COMPARISON:  05/22/2021 FINDINGS: Brain: No evidence of acute infarction, hemorrhage, extra-axial collection, ventriculomegaly, or mass effect. Generalized cerebral atrophy. Periventricular white matter low attenuation likely secondary to microangiopathy. Vascular: Cerebrovascular atherosclerotic calcifications are noted. No hyperdense vessels. Skull: Negative for fracture or focal lesion. Sinuses/Orbits: Visualized portions of the orbits are unremarkable. Complete opacification of the left sphenoid sinus. Visualized portions of the mastoid air cells are unremarkable. Other: Right frontal scalp hematoma. CT CERVICAL SPINE FINDINGS Alignment: 2 mm anterolisthesis of C2 on C3 secondary to facet disease. 2 mm anterolisthesis of C7 on T1 secondary to facet disease. Skull base and vertebrae: No acute fracture. No primary bone lesion or focal pathologic process. Soft tissues and spinal canal: No prevertebral fluid or swelling. No visible canal hematoma. Disc levels: Degenerative disease with disc height loss at C3-4, C4-5, C5-6, C6-7 and, T1-2 and T2-3. At C2-3 there is moderate left facet arthropathy. At C3-4 there is bilateral uncovertebral degenerative changes, mild bilateral facet arthropathy, and no significant foraminal stenosis. At C4-5 there is a broad-based disc osteophyte complex, bilateral uncovertebral degenerative changes, and bilateral mild foraminal stenosis. At C5-6 there is a broad-based disc osteophyte complex, bilateral uncovertebral degenerative changes, and left foraminal stenosis. At C6-7 there is a  broad-based disc osteophyte complex, bilateral uncovertebral degenerative changes and left foraminal stenosis. At C7-T1 there is moderate left facet arthropathy. At T2-3 there is bilateral facet arthropathy. Mild Upper chest: Centrilobular emphysema. Other: Bilateral carotid artery atherosclerosis. IMPRESSION: 1. No acute intracranial pathology. 2. Right frontal scalp hematoma. Chronic left sphenoid sinus disease. 3.  No acute osseous injury of the cervical spine. 4. Cervical spine spondylosis as described above. Electronically Signed   By: Kathreen Devoid M.D.   On: 06/20/2021 16:08    Procedures Procedures    Medications Ordered in ED Medications - No data to display  ED Course/ Medical Decision Making/ A&P Clinical Course as of 06/20/21 1630  Sat Jun 20, 2021  1627 Patient's daughter Blanch Media at the bedside and updated her regarding the patient's work-up.  We will dress her lower extremity wound and her daughter will take her back to the nursing facility. [MT]    Clinical Course User Index [MT] Fanta Wimberley, Carola Rhine, MD  Medical Decision Making Amount and/or Complexity of Data Reviewed Labs: ordered. Radiology: ordered.   Patient is here with a fall, some visible bruising around her face on exam.  I personally reviewed and interpreted her imaging including CT imaging.  These were notable for  no acute traumatic injuries, no ICH  I personally reviewed and interpreted the patient's basic labs, which showed no sig abnormalities  I suspect this is likely a mechanical fall from the history provided; less likely acute PE, acute anemia, ACS, or sepsis.  We will clean and dress her right lower extremity wound with Xeroform and Kerlix.  Will likely heal on its own.  The skin is too friable for sutures, and there is only fat beneath the skin which will not hold for suturing.  She can wear this bandage for 5 days, then can be taken down at her facility.        Final  Clinical Impression(s) / ED Diagnoses Final diagnoses:  Fall, initial encounter  Contusion of face, initial encounter  Laceration of right lower extremity, initial encounter    Rx / DC Orders ED Discharge Orders     None         Arhaan Chesnut, Carola Rhine, MD 06/20/21 1630

## 2021-06-20 NOTE — ED Notes (Signed)
Pt to return to [facility] with family. Report given to pt's daughter, discharge instructions reviewed, no further questions ?

## 2021-06-20 NOTE — Discharge Instructions (Signed)
Please keep the Kerlix and Xeroform on the right leg wound for the next 4 days, and you can remove it 06/24/21 and leave open to air. ? ?CT scan of the brain and cervical spine did not show any obvious traumatic injuries or brain bleeds.  Emma Yang may develop a "black eye" as the blood recitals around her hematoma site over the next few days. ?

## 2021-06-20 NOTE — ED Notes (Signed)
Family at bedside, updated on plan of care.

## 2021-06-22 DIAGNOSIS — R278 Other lack of coordination: Secondary | ICD-10-CM | POA: Diagnosis not present

## 2021-06-22 DIAGNOSIS — Z20822 Contact with and (suspected) exposure to covid-19: Secondary | ICD-10-CM | POA: Diagnosis not present

## 2021-06-22 DIAGNOSIS — R2681 Unsteadiness on feet: Secondary | ICD-10-CM | POA: Diagnosis not present

## 2021-06-22 DIAGNOSIS — M6281 Muscle weakness (generalized): Secondary | ICD-10-CM | POA: Diagnosis not present

## 2021-06-22 DIAGNOSIS — R296 Repeated falls: Secondary | ICD-10-CM | POA: Diagnosis not present

## 2021-06-23 DIAGNOSIS — R278 Other lack of coordination: Secondary | ICD-10-CM | POA: Diagnosis not present

## 2021-06-23 DIAGNOSIS — R2681 Unsteadiness on feet: Secondary | ICD-10-CM | POA: Diagnosis not present

## 2021-06-23 DIAGNOSIS — R296 Repeated falls: Secondary | ICD-10-CM | POA: Diagnosis not present

## 2021-06-23 DIAGNOSIS — M6281 Muscle weakness (generalized): Secondary | ICD-10-CM | POA: Diagnosis not present

## 2021-06-24 DIAGNOSIS — M6281 Muscle weakness (generalized): Secondary | ICD-10-CM | POA: Diagnosis not present

## 2021-06-24 DIAGNOSIS — R278 Other lack of coordination: Secondary | ICD-10-CM | POA: Diagnosis not present

## 2021-06-24 DIAGNOSIS — R296 Repeated falls: Secondary | ICD-10-CM | POA: Diagnosis not present

## 2021-06-24 DIAGNOSIS — R2681 Unsteadiness on feet: Secondary | ICD-10-CM | POA: Diagnosis not present

## 2021-06-25 DIAGNOSIS — R296 Repeated falls: Secondary | ICD-10-CM | POA: Diagnosis not present

## 2021-06-25 DIAGNOSIS — L97812 Non-pressure chronic ulcer of other part of right lower leg with fat layer exposed: Secondary | ICD-10-CM | POA: Diagnosis not present

## 2021-06-25 DIAGNOSIS — I480 Paroxysmal atrial fibrillation: Secondary | ICD-10-CM | POA: Diagnosis not present

## 2021-06-25 DIAGNOSIS — I10 Essential (primary) hypertension: Secondary | ICD-10-CM | POA: Diagnosis not present

## 2021-06-25 DIAGNOSIS — D6869 Other thrombophilia: Secondary | ICD-10-CM | POA: Diagnosis not present

## 2021-06-26 DIAGNOSIS — R296 Repeated falls: Secondary | ICD-10-CM | POA: Diagnosis not present

## 2021-06-26 DIAGNOSIS — M6281 Muscle weakness (generalized): Secondary | ICD-10-CM | POA: Diagnosis not present

## 2021-06-26 DIAGNOSIS — R278 Other lack of coordination: Secondary | ICD-10-CM | POA: Diagnosis not present

## 2021-06-26 DIAGNOSIS — R2681 Unsteadiness on feet: Secondary | ICD-10-CM | POA: Diagnosis not present

## 2021-06-29 DIAGNOSIS — Z20822 Contact with and (suspected) exposure to covid-19: Secondary | ICD-10-CM | POA: Diagnosis not present

## 2021-06-29 DIAGNOSIS — M6281 Muscle weakness (generalized): Secondary | ICD-10-CM | POA: Diagnosis not present

## 2021-06-29 DIAGNOSIS — R2681 Unsteadiness on feet: Secondary | ICD-10-CM | POA: Diagnosis not present

## 2021-06-29 DIAGNOSIS — R278 Other lack of coordination: Secondary | ICD-10-CM | POA: Diagnosis not present

## 2021-06-29 DIAGNOSIS — R296 Repeated falls: Secondary | ICD-10-CM | POA: Diagnosis not present

## 2021-06-30 DIAGNOSIS — R278 Other lack of coordination: Secondary | ICD-10-CM | POA: Diagnosis not present

## 2021-06-30 DIAGNOSIS — R296 Repeated falls: Secondary | ICD-10-CM | POA: Diagnosis not present

## 2021-06-30 DIAGNOSIS — R2681 Unsteadiness on feet: Secondary | ICD-10-CM | POA: Diagnosis not present

## 2021-06-30 DIAGNOSIS — M6281 Muscle weakness (generalized): Secondary | ICD-10-CM | POA: Diagnosis not present

## 2021-07-03 DIAGNOSIS — R2681 Unsteadiness on feet: Secondary | ICD-10-CM | POA: Diagnosis not present

## 2021-07-03 DIAGNOSIS — R296 Repeated falls: Secondary | ICD-10-CM | POA: Diagnosis not present

## 2021-07-03 DIAGNOSIS — M6281 Muscle weakness (generalized): Secondary | ICD-10-CM | POA: Diagnosis not present

## 2021-07-03 DIAGNOSIS — R278 Other lack of coordination: Secondary | ICD-10-CM | POA: Diagnosis not present

## 2021-07-06 DIAGNOSIS — Z20822 Contact with and (suspected) exposure to covid-19: Secondary | ICD-10-CM | POA: Diagnosis not present

## 2021-07-07 DIAGNOSIS — M6281 Muscle weakness (generalized): Secondary | ICD-10-CM | POA: Diagnosis not present

## 2021-07-07 DIAGNOSIS — R2681 Unsteadiness on feet: Secondary | ICD-10-CM | POA: Diagnosis not present

## 2021-07-07 DIAGNOSIS — R296 Repeated falls: Secondary | ICD-10-CM | POA: Diagnosis not present

## 2021-07-07 DIAGNOSIS — R278 Other lack of coordination: Secondary | ICD-10-CM | POA: Diagnosis not present

## 2021-07-09 DIAGNOSIS — R2681 Unsteadiness on feet: Secondary | ICD-10-CM | POA: Diagnosis not present

## 2021-07-09 DIAGNOSIS — M6281 Muscle weakness (generalized): Secondary | ICD-10-CM | POA: Diagnosis not present

## 2021-07-09 DIAGNOSIS — R296 Repeated falls: Secondary | ICD-10-CM | POA: Diagnosis not present

## 2021-07-09 DIAGNOSIS — R278 Other lack of coordination: Secondary | ICD-10-CM | POA: Diagnosis not present

## 2021-07-10 DIAGNOSIS — M6281 Muscle weakness (generalized): Secondary | ICD-10-CM | POA: Diagnosis not present

## 2021-07-10 DIAGNOSIS — R278 Other lack of coordination: Secondary | ICD-10-CM | POA: Diagnosis not present

## 2021-07-10 DIAGNOSIS — R296 Repeated falls: Secondary | ICD-10-CM | POA: Diagnosis not present

## 2021-07-10 DIAGNOSIS — R2681 Unsteadiness on feet: Secondary | ICD-10-CM | POA: Diagnosis not present

## 2021-07-13 DIAGNOSIS — Z20822 Contact with and (suspected) exposure to covid-19: Secondary | ICD-10-CM | POA: Diagnosis not present

## 2021-07-20 DIAGNOSIS — Z20822 Contact with and (suspected) exposure to covid-19: Secondary | ICD-10-CM | POA: Diagnosis not present

## 2021-09-15 DIAGNOSIS — Z45018 Encounter for adjustment and management of other part of cardiac pacemaker: Secondary | ICD-10-CM | POA: Diagnosis not present

## 2021-09-15 DIAGNOSIS — I4821 Permanent atrial fibrillation: Secondary | ICD-10-CM | POA: Diagnosis not present

## 2021-09-25 DIAGNOSIS — Z45018 Encounter for adjustment and management of other part of cardiac pacemaker: Secondary | ICD-10-CM | POA: Diagnosis not present

## 2021-12-15 DIAGNOSIS — I4821 Permanent atrial fibrillation: Secondary | ICD-10-CM | POA: Diagnosis not present

## 2021-12-15 DIAGNOSIS — Z45018 Encounter for adjustment and management of other part of cardiac pacemaker: Secondary | ICD-10-CM | POA: Diagnosis not present

## 2022-03-17 DIAGNOSIS — Z950 Presence of cardiac pacemaker: Secondary | ICD-10-CM | POA: Diagnosis not present

## 2022-03-17 DIAGNOSIS — R55 Syncope and collapse: Secondary | ICD-10-CM | POA: Diagnosis not present

## 2022-03-17 DIAGNOSIS — Z45018 Encounter for adjustment and management of other part of cardiac pacemaker: Secondary | ICD-10-CM | POA: Diagnosis not present

## 2022-03-24 DIAGNOSIS — L97511 Non-pressure chronic ulcer of other part of right foot limited to breakdown of skin: Secondary | ICD-10-CM | POA: Diagnosis not present

## 2022-03-24 DIAGNOSIS — I739 Peripheral vascular disease, unspecified: Secondary | ICD-10-CM | POA: Diagnosis not present

## 2022-03-24 DIAGNOSIS — M2041 Other hammer toe(s) (acquired), right foot: Secondary | ICD-10-CM | POA: Diagnosis not present

## 2022-03-24 DIAGNOSIS — B351 Tinea unguium: Secondary | ICD-10-CM | POA: Diagnosis not present

## 2022-03-28 DIAGNOSIS — Z743 Need for continuous supervision: Secondary | ICD-10-CM | POA: Diagnosis not present

## 2022-03-28 DIAGNOSIS — R531 Weakness: Secondary | ICD-10-CM | POA: Diagnosis not present

## 2022-03-29 ENCOUNTER — Emergency Department (HOSPITAL_COMMUNITY)
Admission: EM | Admit: 2022-03-29 | Discharge: 2022-03-29 | Disposition: A | Discharge status: Skilled Nursing Facility | Attending: Emergency Medicine | Admitting: Emergency Medicine

## 2022-03-29 ENCOUNTER — Other Ambulatory Visit: Payer: Self-pay

## 2022-03-29 ENCOUNTER — Encounter (HOSPITAL_COMMUNITY): Payer: Self-pay | Admitting: Emergency Medicine

## 2022-03-29 DIAGNOSIS — Z743 Need for continuous supervision: Secondary | ICD-10-CM | POA: Diagnosis not present

## 2022-03-29 DIAGNOSIS — S80922A Unspecified superficial injury of left lower leg, initial encounter: Secondary | ICD-10-CM | POA: Diagnosis present

## 2022-03-29 DIAGNOSIS — X58XXXA Exposure to other specified factors, initial encounter: Secondary | ICD-10-CM | POA: Insufficient documentation

## 2022-03-29 DIAGNOSIS — Z79899 Other long term (current) drug therapy: Secondary | ICD-10-CM | POA: Diagnosis not present

## 2022-03-29 DIAGNOSIS — S71119A Laceration without foreign body, unspecified thigh, initial encounter: Secondary | ICD-10-CM

## 2022-03-29 DIAGNOSIS — Z7901 Long term (current) use of anticoagulants: Secondary | ICD-10-CM | POA: Diagnosis not present

## 2022-03-29 DIAGNOSIS — S71112A Laceration without foreign body, left thigh, initial encounter: Secondary | ICD-10-CM | POA: Diagnosis not present

## 2022-03-29 DIAGNOSIS — S81811A Laceration without foreign body, right lower leg, initial encounter: Secondary | ICD-10-CM | POA: Diagnosis not present

## 2022-03-29 DIAGNOSIS — S81819A Laceration without foreign body, unspecified lower leg, initial encounter: Secondary | ICD-10-CM

## 2022-03-29 DIAGNOSIS — I10 Essential (primary) hypertension: Secondary | ICD-10-CM | POA: Diagnosis not present

## 2022-03-29 DIAGNOSIS — S81812A Laceration without foreign body, left lower leg, initial encounter: Secondary | ICD-10-CM | POA: Insufficient documentation

## 2022-03-29 DIAGNOSIS — S71129A Laceration with foreign body, unspecified thigh, initial encounter: Secondary | ICD-10-CM | POA: Diagnosis not present

## 2022-03-29 MED ORDER — LIDOCAINE-EPINEPHRINE-TETRACAINE (LET) TOPICAL GEL
3.0000 mL | Freq: Once | TOPICAL | Status: AC
  Administered 2022-03-29: 3 mL via TOPICAL
  Filled 2022-03-29: qty 3

## 2022-03-29 NOTE — ED Notes (Signed)
Called report to [facility] at [facility] and informed them that pt is d/c and will call for transport

## 2022-03-29 NOTE — ED Provider Notes (Signed)
[facility] DEPT Provider Note   CSN: [id number] Arrival date & time: 03/29/22  0006     History  Chief Complaint  Patient presents with   Laceration    Lac noted to L leg     Emma Yang is a 88 y.o. female.  The history is provided by the EMS personnel. The history is limited by the condition of the patient (level 5 caveat dementia).  Laceration Location:  Leg Leg laceration location:  L upper leg (medial just proximal to the knee, several skin tears above and below the knee LLE) Depth:  Through dermis Quality: jagged   Bleeding: controlled   Laceration mechanism:  Unable to specify Pain details:    Progression:  Unchanged Relieved by:  Nothing Worsened by:  Nothing Ineffective treatments:  None tried Associated symptoms: no fever   Patient on hospice with skin tears and laceration to the LLE.  No reported falls patient denies all pain.       Home Medications Prior to Admission medications   Medication Sig Start Date End Date Taking? Authorizing Provider  apixaban (ELIQUIS) 2.5 MG TABS tablet Take 2.5 mg by mouth 2 (two) times daily.     [provider]  carbamide peroxide (GNP EARWAX REMOVAL KIT) 6.5 % OTIC solution 2 drops. Into the affected ear once per week as needed for ear wax impaction.    [provider]  cephALEXin (KEFLEX) 250 MG capsule Take by mouth 1 day or 1 dose.    [provider]  Cholecalciferol (VITAMIN D-3 PO) Take by mouth. 2000u by mouth daily    [provider]  Dentifrices (BIOTENE DRY MOUTH) PSTE Place onto teeth. Provide to use as toothpaste once daily.    [provider]  dofetilide (TIKOSYN) 250 MCG capsule Take 250 mcg by mouth 2 (two) times daily.    [provider]  ferrous sulfate 325 (65 FE) MG tablet Take 325 mg by mouth 2 (two) times daily with a meal.     [provider]  fluticasone (FLONASE) 50 MCG/ACT nasal spray Place 1 spray into  both nostrils daily.  06/02/18   [provider]  ketoconazole (NIZORAL) 2 % shampoo Apply 1 application topically 2 (two) times a week.    [provider]  levothyroxine (SYNTHROID, LEVOTHROID) 25 MCG tablet Take 25 mcg by mouth daily before breakfast.  07/17/18   [provider]  losartan (COZAAR) 50 MG tablet Take 50 mg by mouth at bedtime.  01/16/18   [provider]  meclizine (ANTIVERT) 25 MG tablet Take 25 mg by mouth 2 (two) times daily.    [provider]  nitrofurantoin, macrocrystal-monohydrate, (MACROBID) 100 MG capsule Take 100 mg by mouth every 12 (twelve) hours. For 7 days.    [provider]  omeprazole (PRILOSEC) 20 MG capsule Take 20 mg by mouth daily.     [provider]  oxybutynin (DITROPAN) 5 MG tablet Take 5 mg by mouth every 6 (six) hours.    Welford Roche, NP  UNABLE TO FIND Place 3 drops into both ears 3 (three) times a week. Med Name: [facility]    [provider]  vitamin B-12 (CYANOCOBALAMIN) 1000 MCG tablet Take 1,000 mcg by mouth daily.     [provider]      Allergies    Formaldehyde, Other, Levaquin [levofloxacin], and Tape    Review of Systems   Review of Systems  Constitutional:  Negative  for fever.  HENT:  Negative for facial swelling.   Eyes:  Negative for redness.  Respiratory:  Negative for wheezing and stridor.   All other systems reviewed and are negative.   Physical Exam Updated Vital Signs Ht _0  (1.575 m)   Wt 59 kg   BMI 23.78 kg/m  Physical Exam Vitals and nursing note reviewed.  Constitutional:      General: She is not in acute distress.    Appearance: Normal appearance. She is well-developed.  HENT:     Head: Normocephalic and atraumatic.     Right Ear: Tympanic membrane normal.     Left Ear: Tympanic membrane normal.     Nose: Nose normal.  Eyes:     Pupils: Pupils are equal, round, and reactive to light.  Cardiovascular:     Rate and  Rhythm: Normal rate and regular rhythm.     Pulses: Normal pulses.     Heart sounds: Normal heart sounds.  Pulmonary:     Effort: Pulmonary effort is normal. No respiratory distress.     Breath sounds: Normal breath sounds.  Abdominal:     General: Bowel sounds are normal. There is no distension.     Palpations: Abdomen is soft.     Tenderness: There is no abdominal tenderness. There is no guarding or rebound.  Genitourinary:    Vagina: No vaginal discharge.  Musculoskeletal:        General: Normal range of motion.     Cervical back: Normal and neck supple.     Thoracic back: Normal.     Lumbar back: Normal.     Right hip: Normal.     Left hip: Normal.     Left knee: No swelling or effusion. Normal range of motion. No LCL laxity, MCL laxity, ACL laxity or PCL laxity.Normal pulse.     Comments: No patella alta or b[place].  Non tender hips no foreshortening or rotation.  FROM of the LLE  Skin:    General: Skin is dry.     Capillary Refill: Capillary refill takes less than 2 seconds.     Findings: No erythema or rash.       Neurological:     General: No focal deficit present.     Mental Status: She is alert.     Deep Tendon Reflexes: Reflexes normal.  Psychiatric:        Mood and Affect: Mood normal.     ED Results / Procedures / Treatments   Labs (all labs ordered are listed, but only abnormal results are displayed) Labs Reviewed - No data to display  EKG None  Radiology No results found.  Procedures .Marland KitchenLaceration Repair  Date/Time: 03/29/2022 1:11 AM  Performed by: Veatrice Kells, MD Authorized by: Veatrice Kells, MD   Consent:    Consent obtained:  Verbal   Consent given by:  Patient   Risks discussed:  Infection, need for additional repair and nerve damage   Alternatives discussed:  No treatment Universal protocol:    Patient identity confirmed:  Arm band Anesthesia:    Anesthesia method:  Topical application   Topical anesthetic:  LET Laceration details:     Length (cm):  7.8   Depth (mm):  1 Pre-procedure details:    Preparation:  Patient was prepped and draped in usual sterile fashion Exploration:    Limited defect created (wound extended): no     Hemostasis achieved with:  Direct pressure   Imaging outcome: foreign body noted  Wound extent: fascia not violated   Treatment:    Area cleansed with:  Chlorhexidine, povidone-iodine and saline   Amount of cleaning:  Extensive   Debridement:  None   Undermining:  None Skin repair:    Repair method:  Staples   Number of staples:  12 Approximation:    Approximation:  Close Repair type:    Repair type:  Complex Post-procedure details:    Dressing:  Sterile dressing   Procedure completion:  Tolerated well, no immediate complications     Medications Ordered in ED Medications  lidocaine-EPINEPHrine-tetracaine (LET) topical gel (3 mLs Topical Given 03/29/22 0036)    ED Course/ Medical Decision Making/ A&P                           Medical Decision Making Skins tears and lacerations on the LLE from unknown cause   Amount and/or Complexity of Data Reviewed Independent Historian: EMS    Details: See above  External Data Reviewed: notes.    Details: Previous notes reviewed   Risk Risk Details: Per staff report there was not a fall.  Wound is hemostatic.  Wound cleansed and repaired in the ED.  Stable for discharge.  Strict return precautions.      Final Clinical Impression(s) / ED Diagnoses Final diagnoses:  Skin tear of lower leg without complication, initial encounter  Laceration of thigh, unspecified laterality, initial encounter   Return for intractable cough, coughing up blood, fevers > 100.4 unrelieved by medication, shortness of breath, intractable vomiting, chest pain, shortness of breath, weakness, numbness, changes in speech, facial asymmetry, abdominal pain, passing out, Inability to tolerate liquids or food, cough, altered mental status or any concerns. No signs  of systemic illness or infection. The patient is nontoxic-appearing on exam and vital signs are within normal limits.  I have reviewed the triage vital signs and the nursing notes. Pertinent labs & imaging results that were available during my care of the patient were reviewed by me and considered in my medical decision making (see chart for details). After history, exam, and medical workup I feel the patient has been appropriately medically screened and is safe for discharge home. Pertinent diagnoses were discussed with the patient. Patient was given return precautions.  Rx / DC Orders ED Discharge Orders     None         Harvin Konicek, MD 03/29/22 4982

## 2022-03-29 NOTE — ED Triage Notes (Signed)
Pt to ED via EMS from [facility] at [facility] in the dementia unit. Pt currently under hospice care. Pt presents with Laceration to L leg. Facility unsure how pt got this. They denied pt falling. States pt could have gotten while transferring. Pt bedbound. Facility wrapped L leg. Bleeding controlled   BP 146/98 HR 60 O2 95% on RA

## 2022-06-24 DIAGNOSIS — I4430 Unspecified atrioventricular block: Secondary | ICD-10-CM | POA: Diagnosis not present

## 2022-06-24 DIAGNOSIS — I4891 Unspecified atrial fibrillation: Secondary | ICD-10-CM | POA: Diagnosis not present

## 2022-06-24 DIAGNOSIS — Z45018 Encounter for adjustment and management of other part of cardiac pacemaker: Secondary | ICD-10-CM | POA: Diagnosis not present

## 2022-11-18 DEATH — deceased
